# Patient Record
Sex: Male | Born: 1995 | Race: Black or African American | Hispanic: No | Marital: Single | Smoking: Current every day smoker
Health system: Southern US, Community
[De-identification: ages and names within clinical notes are randomized; demographics above are authoritative.]

## PROBLEM LIST (undated history)

## (undated) DIAGNOSIS — R454 Irritability and anger: Secondary | ICD-10-CM

## (undated) DIAGNOSIS — F32A Depression, unspecified: Secondary | ICD-10-CM

## (undated) DIAGNOSIS — L309 Dermatitis, unspecified: Secondary | ICD-10-CM

## (undated) DIAGNOSIS — W3400XA Accidental discharge from unspecified firearms or gun, initial encounter: Secondary | ICD-10-CM

## (undated) DIAGNOSIS — Q249 Congenital malformation of heart, unspecified: Secondary | ICD-10-CM

## (undated) DIAGNOSIS — F329 Major depressive disorder, single episode, unspecified: Secondary | ICD-10-CM

## (undated) HISTORY — DX: Major depressive disorder, single episode, unspecified: F32.9

## (undated) HISTORY — DX: Depression, unspecified: F32.A

## (undated) HISTORY — DX: Congenital malformation of heart, unspecified: Q24.9

## (undated) HISTORY — DX: Dermatitis, unspecified: L30.9

## (undated) HISTORY — PX: CARDIAC SURGERY: SHX584

## (undated) HISTORY — PX: EXPLORATION POST OPERATIVE OPEN HEART: SHX5061

## (undated) HISTORY — DX: Irritability and anger: R45.4

## (undated) HISTORY — PX: LUNG SURGERY: SHX703

---
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
## 2014-01-01 MED ORDER — OXYCODONE-ACETAMINOPHEN 5-325 MG PO TABS
1.0000 | ORAL_TABLET | Freq: Once | ORAL | Status: AC
  Administered 2014-01-01: 1 via ORAL
  Filled 2014-01-01: qty 1

## 2014-01-01 MED ORDER — LIDOCAINE HCL 1 % IJ SOLN
INTRAMUSCULAR | Status: AC
  Filled 2014-01-01: qty 20

## 2014-01-01 NOTE — ED Notes (Signed)
Pts R arm sutured and dressed by Dr Patria Mane

## 2014-01-01 NOTE — ED Provider Notes (Signed)
Medical screening examination/treatment/procedure(s) were conducted as a shared visit with non-physician practitioner(s) and myself.  I personally evaluated the patient during the encounter.   EKG Interpretation None      LACERATION REPAIR Performed by: Lyanne Co Consent: Verbal consent obtained. Risks and benefits: risks, benefits and alternatives were discussed Patient identity confirmed: provided demographic data Time out performed prior to procedure Prepped and Draped in normal sterile fashion Wound explored Laceration Location: Left distal upper arm on the lateral aspect Laceration Length: 3 cm No Foreign Bodies seen or palpated Anesthesia: local infiltration Local anesthetic: lidocaine 2 % with epinephrine Anesthetic total: 7 ml Irrigation method: syringe Amount of cleaning: standard Skin closure: 4-0 Prolene  Number of sutures or staples: 6  Technique: Running interlocked  Patient tolerance: Patient tolerated the procedure well with no immediate complications.  Normal left radial pulse.  Full range of motion of left elbow.  X-ray without abnormalities.  Discharge him in good condition.  Infection warnings given.   Lyanne Co, MD 01/01/14 [phone]

## 2014-02-21 ENCOUNTER — Encounter (HOSPITAL_COMMUNITY): Payer: Self-pay | Admitting: Adult Health

## 2014-02-21 ENCOUNTER — Emergency Department (HOSPITAL_COMMUNITY)
Admission: EM | Admit: 2014-02-21 | Discharge: 2014-02-21 | Disposition: A | Discharge status: Home / Self Care | Payer: Medicaid Other | Attending: Emergency Medicine | Admitting: Emergency Medicine

## 2014-02-21 ENCOUNTER — Emergency Department (HOSPITAL_COMMUNITY): Payer: Medicaid Other

## 2014-02-21 DIAGNOSIS — Z720 Tobacco use: Secondary | ICD-10-CM | POA: Insufficient documentation

## 2014-02-21 DIAGNOSIS — R079 Chest pain, unspecified: Secondary | ICD-10-CM

## 2014-02-21 DIAGNOSIS — Z791 Long term (current) use of non-steroidal anti-inflammatories (NSAID): Secondary | ICD-10-CM | POA: Insufficient documentation

## 2014-02-21 DIAGNOSIS — Z9889 Other specified postprocedural states: Secondary | ICD-10-CM | POA: Insufficient documentation

## 2014-02-21 DIAGNOSIS — R05 Cough: Secondary | ICD-10-CM | POA: Insufficient documentation

## 2014-02-21 LAB — BASIC METABOLIC PANEL
Anion gap: 14 (ref 5–15)
BUN: 11 mg/dL (ref 6–23)
CO2: 26 mEq/L (ref 19–32)
Calcium: 9.6 mg/dL (ref 8.4–10.5)
Chloride: 104 mEq/L (ref 96–112)
Creatinine, Ser: 1.28 mg/dL (ref 0.50–1.35)
GFR, EST NON AFRICAN AMERICAN: 80 mL/min — AB (ref >90)
Glucose, Bld: 111 mg/dL — ABNORMAL HIGH (ref 70–99)
POTASSIUM: 4.4 meq/L (ref 3.7–5.3)
Sodium: 144 mEq/L (ref 137–147)

## 2014-02-21 LAB — CBC
HCT: 46.6 % (ref 39.0–52.0)
Hemoglobin: 15.9 g/dL (ref 13.0–17.0)
MCH: 30.8 pg (ref 26.0–34.0)
MCHC: 34.1 g/dL (ref 30.0–36.0)
MCV: 90.1 fL (ref 78.0–100.0)
PLATELETS: 225 10*3/uL (ref 150–400)
RBC: 5.17 MIL/uL (ref 4.22–5.81)
RDW: 12.5 % (ref 11.5–15.5)
WBC: 6.4 10*3/uL (ref 4.0–10.5)

## 2014-02-21 LAB — I-STAT TROPONIN, ED: TROPONIN I, POC: 0.01 ng/mL (ref 0.00–0.08)

## 2014-02-21 LAB — D-DIMER, QUANTITATIVE: D-Dimer, Quant: <0.27 ug/mL-FEU (ref 0.00–0.48)

## 2014-02-21 MED ORDER — IBUPROFEN 600 MG PO TABS: 600.0000 mg | ORAL_TABLET | Freq: Three times a day (TID) | ORAL | Status: AC

## 2014-02-21 MED ORDER — KETOROLAC TROMETHAMINE 30 MG/ML IJ SOLN
30.0000 mg | Freq: Once | INTRAMUSCULAR | Status: AC
  Administered 2014-02-21: 30 mg via INTRAMUSCULAR
  Filled 2014-02-21: qty 1

## 2014-02-21 NOTE — ED Notes (Signed)
Presents with left sided chest pain described as sharp and intermittent began this AM associated with dizziness and worse pain when breathing in. Hx of open heart surgery as a child. Current smoker, non drinker, denies drug use. Nothing makes pain better.

## 2014-02-21 NOTE — ED Provider Notes (Signed)
CSN: [id number]     Arrival date & time 02/21/14  1610 History   First MD Initiated Contact with Patient 02/21/14 1752     Chief Complaint  Patient presents with  . Chest Pain      HPI  Patient is a concern of pain in his left upper chest. Patient is worse with deep inspiration. There is no new dyspnea, no exertional pain. No headaches, fever, chills. Patient smokes.  Discussed smoking cessation, at length, with patient.   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  No complication, no issues since childhood.    History reviewed. No pertinent past medical history. Past Surgical History  Procedure Laterality Date  . Cardiac surgery    . Lung surgery     History reviewed. No pertinent family history. History  Substance Use Topics  . Smoking status: Current Every Day Smoker  . Smokeless tobacco: Not on file  . Alcohol Use: Yes    Review of Systems  Constitutional:       Per HPI, otherwise negative  HENT:       Per HPI, otherwise negative  Respiratory: Positive for cough and chest tightness.   Cardiovascular:       Per HPI, otherwise negative  Gastrointestinal: Negative for vomiting.  Endocrine:       Negative aside from HPI  Genitourinary:       Neg aside from HPI   Musculoskeletal:       Per HPI, otherwise negative  Skin: Negative.   Neurological: Negative for syncope and weakness.      Allergies  Review of patient's allergies indicates no known allergies.  Home Medications   Prior to Admission medications   Medication Sig Start Date End Date Taking? Authorizing Provider  naproxen (NAPROSYN) 500 MG tablet Take 1 tablet (500 mg total) by mouth 2 (two) times daily. 10/30/13   Robyn M Hess, PA-C  sodium chloride (OCEAN) 0.65 % SOLN nasal spray Place 1 spray into both nostrils as needed for congestion. 08/24/13   Junius FinnerErin O'Malley, PA-C   BP 107/81 mmHg  Pulse 84  Temp(Src) 98.1 F (36.7 C) (Oral)  Resp 20  Ht 5'  10" (1.778 m)  Wt 172 lb (78.019 kg)  BMI 24.68 kg/m2  SpO2 97% Physical Exam  Constitutional: He is oriented to person, place, and time. He appears well-developed. No distress.  HENT:  Head: Normocephalic and atraumatic.  Eyes: Conjunctivae and EOM are normal.  Cardiovascular: Normal rate and regular rhythm.   Prominent murmur audible in all fields  Pulmonary/Chest: Effort normal. No stridor. No respiratory distress.    Abdominal: He exhibits no distension.  Musculoskeletal: He exhibits no edema.  Neurological: He is alert and oriented to person, place, and time.  Skin: Skin is warm and dry.  Psychiatric: He has a normal mood and affect.  Nursing note and vitals reviewed.   ED Course  Procedures (including critical care time) Labs Review Labs Reviewed  BASIC METABOLIC PANEL - Abnormal; Notable for the following:    Glucose, Bld 111 (*)    GFR calc non Af Amer 80 (*)    All other components within normal limits  CBC  D-DIMER, QUANTITATIVE  I-STAT TROPOININ, ED    Imaging Review Dg Chest 2 View  02/21/2014   CLINICAL DATA:  Intermittent left-sided chest pain starting this a.m., dizziness  EXAM: CHEST  2 VIEW  COMPARISON:  None.  FINDINGS: Borderline cardiomegaly is noted. Postsurgical changes  are noted with a vascular stent in right hilum. No acute infiltrate or pulmonary edema. Surgical clips are noted in GE junction region. Bony thorax is unremarkable.  IMPRESSION: No active disease.  Postsurgical changes as described above.   Electronically Signed   By: Natasha MeadLiviu  Pop M.D.   On: 02/21/2014 17:24     EKG Interpretation   Date/Time:  Tuesday February 21 2014 16:14:09 EST Ventricular Rate:  89 PR Interval:  154 QRS Duration: 168 QT Interval:  412 QTC Calculation: 501 R Axis:   -178 Text Interpretation:  Normal sinus rhythm Right bundle branch block  Anterior infarct , age undetermined Abnormal ECG Sinus rhythm Non-specific  intra-ventricular conduction delay T wave  abnormality No significant  change since last tracing Abnormal ekg Confirmed by Gerhard MunchLOCKWOOD, Tanajah Boulter  MD  [phone]([id number]) on 02/21/2014 8:38:36 PM     8:37 PM On repeat exam the patient is awake and alert, talking on the telephone.  He is clearly in no distress. I discussed return precautions, follow-up instructions with patient and his grandmother. MDM   Final diagnoses:  Chest pain    Well-appearing young male presents with reproducible chest pain.  The patient has a history of congenital cardiac disease, there is no suspicion today for new ischemic changes, new pulmonary embolism, new pneumonia. Patient remained in no distress throughout his emergency department course, with no new arrhythmia, and given the reassuring findings, will follow up with his current allergy team tomorrow via telephone.    Gerhard Munchobert Jaci Desanto, MD 02/21/14 2039

## 2014-02-21 NOTE — Discharge Instructions (Signed)
As discussed, your evaluation today has been largely reassuring.  But, it is important that you monitor your condition carefully, and do not hesitate to return to the ED if you develop new, or concerning changes in your condition. ° °Otherwise, please follow-up with your physician for appropriate ongoing care. ° °Chest Pain (Nonspecific) °It is often hard to give a diagnosis for the cause of chest pain. There is always a chance that your pain could be related to something serious, such as a heart attack or a blood clot in the lungs. You need to follow up with your doctor. °HOME CARE °· If antibiotic medicine was given, take it as directed by your doctor. Finish the medicine even if you start to feel better. °· For the next few days, avoid activities that bring on chest pain. Continue physical activities as told by your doctor. °· Do not use any tobacco products. This includes cigarettes, chewing tobacco, and e-cigarettes. °· Avoid drinking alcohol. °· Only take medicine as told by your doctor. °· Follow your doctor's suggestions for more testing if your chest pain does not go away. °· Keep all doctor visits you made. °GET HELP IF: °· Your chest pain does not go away, even after treatment. °· You have a rash with blisters on your chest. °· You have a fever. °GET HELP RIGHT AWAY IF:  °· You have more pain or pain that spreads to your arm, neck, jaw, back, or belly (abdomen). °· You have shortness of breath. °· You cough more than usual or cough up blood. °· You have very bad back or belly pain. °· You feel sick to your stomach (nauseous) or throw up (vomit). °· You have very bad weakness. °· You pass out (faint). °· You have chills. °This is an emergency. Do not wait to see if the problems will go away. Call your local emergency services (911 in [place] Do not drive yourself to the hospital. °MAKE SURE YOU:  °· Understand these instructions. °· Will watch your condition. °· Will get help right away if you are not doing  well or get worse. °Document Released: 09/17/2007 Document Revised: 04/05/2013 Document Reviewed: 09/17/2007 °[employer]® Patient Information ©2015 [employer]. This information is not intended to replace advice given to you by your health care provider. Make sure you discuss any questions you have with your health care provider. ° °

## 2014-04-17 ENCOUNTER — Encounter (HOSPITAL_COMMUNITY): Payer: Self-pay | Admitting: Emergency Medicine

## 2014-04-17 DIAGNOSIS — R011 Cardiac murmur, unspecified: Secondary | ICD-10-CM | POA: Insufficient documentation

## 2014-04-17 DIAGNOSIS — R51 Headache: Secondary | ICD-10-CM | POA: Insufficient documentation

## 2014-04-17 DIAGNOSIS — M542 Cervicalgia: Secondary | ICD-10-CM | POA: Insufficient documentation

## 2014-04-17 DIAGNOSIS — Z720 Tobacco use: Secondary | ICD-10-CM | POA: Insufficient documentation

## 2014-04-17 NOTE — ED Notes (Signed)
States ive had a headache since noon today.  Denies having blurred vision or nausea.  States I've been real sleepy.

## 2014-04-18 ENCOUNTER — Emergency Department (HOSPITAL_COMMUNITY)
Admission: EM | Admit: 2014-04-18 | Discharge: 2014-04-18 | Disposition: A | Discharge status: Home / Self Care | Payer: Medicaid Other | Attending: Emergency Medicine | Admitting: Emergency Medicine

## 2014-04-18 DIAGNOSIS — R519 Headache, unspecified: Secondary | ICD-10-CM

## 2014-04-18 DIAGNOSIS — R51 Headache: Secondary | ICD-10-CM

## 2014-04-18 MED ORDER — ACETAMINOPHEN 500 MG PO TABS: 1000.0000 mg | ORAL_TABLET | Freq: Once | ORAL | Status: DC

## 2014-04-18 MED ORDER — IBUPROFEN 800 MG PO TABS: 800.0000 mg | ORAL_TABLET | Freq: Once | ORAL | Status: DC

## 2014-04-18 NOTE — ED Notes (Signed)
PT monitored by pulse ox, bp cuff. Pt refused gown. RN Vicente SereneGabriel informed.

## 2014-04-18 NOTE — Discharge Instructions (Signed)
You can take ibuprofen 600 mg and/or acetaminophen 1000 mg every 6 hrs as needed for headache or pain. Return to the ED for any problems listed on the head injury sheet.

## 2014-04-18 NOTE — ED Provider Notes (Signed)
CSN: [id number]     Arrival date & time 04/17/14  2241 History  [id number] chart was scribed for Ward GivensIva L Blair Mesina, MD by Abel PrestoKara Demonbreun, ED Scribe. This patient was seen in room A03C/A03C and the patient's care was started at 2:10 AM.    Chief Complaint  Patient presents with  . Headache     Patient is a 19 y.o. male presenting with headaches. The history is provided by the patient. No language interpreter was used.  Headache Associated symptoms: neck pain   Associated symptoms: no congestion, no fever, no nausea, no photophobia, no sore throat and no vomiting    HPI Comments: Dondra PraderKeaundre D Vanroekel is a 19 y.o. male with PMHx of open heart surgery as an infant and heart murmur, who presents to the Emergency Department complaining of 5/10 throbbing headache in the frontal area  with gradual onset around 12 PM yesterday. Pt has not taken anything for relief.  Pt's heat is electric. Pt's significant other is not experiencing headaches.  Pt takes no medication. Pt smokes a pack a day. Pt works for [employer], packing fruit.  Pt fighting sleep in exam room, he notes he would normally be asleep by now. Pt denies photophobia, noise sensitivity, rhinorrhea, nasal congestion, fever, sore throat, neck pain, nausea, vomiting, visual disturbance. During my exam patient's main complaint is that he is sleepy.  PCP none  History reviewed. No pertinent past medical history. Past Surgical History  Procedure Laterality Date  . Cardiac surgery    . Lung surgery     No family history on file. History  Substance Use Topics  . Smoking status: Current Every Day Smoker    Types: Cigarettes  . Smokeless tobacco: Not on file  . Alcohol Use: 0.6 oz/week    1 Cans of beer per week     Comment: occasional  employed Lives at home Has electric heat  Review of Systems  Constitutional: Negative for fever.  HENT: Negative for congestion and sore throat.   Eyes: Negative for photophobia and visual disturbance.   Gastrointestinal: Negative for nausea and vomiting.  Musculoskeletal: Positive for neck pain.  Neurological: Positive for headaches.  All other systems reviewed and are negative.     Allergies  Review of patient's allergies indicates no known allergies.  Home Medications   Prior to Admission medications   Not on File   BP 105/45 mmHg  Pulse 72  Temp(Src) 97.2 F (36.2 C) (Oral)  Resp 18  Ht 5\' 10"  (1.778 m)  Wt 180 lb (81.647 kg)  BMI 25.83 kg/m2  SpO2 100%  Vital signs normal   Physical Exam  Constitutional: He is oriented to person, place, and time. He appears well-developed and well-nourished.  Non-toxic appearance. He does not appear ill. No distress.  HENT:  Head: Normocephalic and atraumatic.  Right Ear: External ear normal.  Left Ear: External ear normal.  Nose: Nose normal. No mucosal edema or rhinorrhea.  Mouth/Throat: Oropharynx is clear and moist and mucous membranes are normal. No dental abscesses or uvula swelling.  Eyes: Conjunctivae and EOM are normal. Pupils are equal, round, and reactive to light.  Neck: Normal range of motion and full passive range of motion without pain. Neck supple.  Cardiovascular: Normal rate, regular rhythm and normal heart sounds.  Exam reveals no gallop and no friction rub.   No murmur heard. systolic ejection murmur heard best in the LUSB, but also heard throughout his chest  Pulmonary/Chest: Effort normal and breath sounds normal.  No respiratory distress. He has no wheezes. He has no rhonchi. He has no rales. He exhibits no tenderness and no crepitus.  Abdominal: Soft. Normal appearance and bowel sounds are normal. He exhibits no distension. There is no tenderness. There is no rebound and no guarding.  Musculoskeletal: Normal range of motion. He exhibits no edema or tenderness.  Moves all extremities well.   Neurological: He is alert and oriented to person, place, and time. He has normal strength. No cranial nerve deficit.   Skin: Skin is warm, dry and intact. No rash noted. No erythema. No pallor.  Psychiatric: He has a normal mood and affect. His speech is normal and behavior is normal. His mood appears not anxious.  Nursing note and vitals reviewed.   ED Course  Procedures (including critical care time)  Medications  acetaminophen (TYLENOL) tablet 1,000 mg (1,000 mg Oral Not Given 04/18/14 0234)  ibuprofen (ADVIL,MOTRIN) tablet 800 mg (800 mg Oral Not Given 04/18/14 0235)    DIAGNOSTIC STUDIES: Oxygen Saturation is 100% on room air, normal by my interpretation.    COORDINATION OF CARE: 2:23 AM Discussed treatment plan with patient at beside, the patient agrees with the plan and has no further questions at this time. Patient was given the choice of taking something oral or getting IV medications for his headaches. Patient states he just wants Tylenol.  Nurse reports however the patient now just wants to go home. He states he can take Tylenol at home. I went back in room to talk to patient he sitting up in no distress. He states he just wants to be discharged.   Labs Review Labs Reviewed - No data to display  Imaging Review No results found.   EKG Interpretation None      MDM   Final diagnoses:  Headache, unspecified headache type    Plan discharge  Devoria Albe, MD, FACEP   I personally performed the services described in this documentation, which was scribed in my presence. The recorded information has been reviewed and considered.  Devoria Albe, MD, Armando Gang     Ward Givens, MD 04/18/14 [phone]

## 2014-04-27 ENCOUNTER — Emergency Department (HOSPITAL_COMMUNITY)
Admission: EM | Admit: 2014-04-27 | Discharge: 2014-04-27 | Disposition: A | Discharge status: Home / Self Care | Payer: Medicaid Other | Attending: Emergency Medicine | Admitting: Emergency Medicine

## 2014-04-27 ENCOUNTER — Encounter (HOSPITAL_COMMUNITY): Payer: Self-pay | Admitting: *Deleted

## 2014-04-27 DIAGNOSIS — Z720 Tobacco use: Secondary | ICD-10-CM | POA: Insufficient documentation

## 2014-04-27 DIAGNOSIS — B349 Viral infection, unspecified: Secondary | ICD-10-CM | POA: Insufficient documentation

## 2014-04-27 DIAGNOSIS — Z008 Encounter for other general examination: Secondary | ICD-10-CM

## 2014-04-27 DIAGNOSIS — Z0289 Encounter for other administrative examinations: Secondary | ICD-10-CM | POA: Insufficient documentation

## 2014-04-27 NOTE — Discharge Instructions (Signed)
Please follow-up with urgent care Center Please seek a primary care provider Please continue to monitor symptoms closely and if symptoms are to worsen or change (fever greater than 101, chills, sweating, nausea, vomiting, chest pain, shortness of breathe, difficulty breathing, weakness, numbness, tingling, worsening or changes to pain pattern, inability to eat any food or fluids down, blood in the stools, black tarry stools) please report back to the Emergency Department immediately.    Emergency Department Resource Guide 1) Find a Doctor and Pay Out of Pocket Although you won't have to find out who is covered by your insurance plan, it is a good idea to ask around and get recommendations. You will then need to call the office and see if the doctor you have chosen will accept you as a new patient and what types of options they offer for patients who are self-pay. Some doctors offer discounts or will set up payment plans for their patients who do not have insurance, but you will need to ask so you aren't surprised when you get to your appointment.  2) Contact Your Local Health Department Not all health departments have doctors that can see patients for sick visits, but many do, so it is worth a call to see if yours does. If you don't know where your local health department is, you can check in your phone book. The CDC also has a tool to help you locate your state's health department, and many state websites also have listings of all of their local health departments.  3) Find a Walk-in Clinic If your illness is not likely to be very severe or complicated, you may want to try a walk in clinic. These are popping up all over the country in pharmacies, drugstores, and shopping centers. They're usually staffed by nurse practitioners or physician assistants that have been trained to treat common illnesses and complaints. They're usually fairly quick and inexpensive. However, if you have serious medical issues  or chronic medical problems, these are probably not your best option.  No Primary Care Doctor: - Call Health Connect at  [phone] - they can help you locate a primary care doctor that  accepts your insurance, provides certain services, etc. - Physician Referral Service- [phone]  Chronic Pain Problems: Organization         Address  Phone   Notes  [facility]  [phone] Patients need to be referred by their primary care doctor.   Medication Assistance: Organization         Address  Phone   Notes  [facility] [address] [place], [place] [address] [phone]) [phone] --Must be a resident of [facility] -- Must have NO insurance coverage whatsoever (no Medicaid/ Medicare, etc.) -- The pt. MUST have a primary care doctor that directs their care regularly and follows them in the community   MedAssist  [phone]   [employer]  [phone]    Agencies that provide inexpensive medical care: Organization         Address  Phone   Notes  [facility]  [phone]   [facility]    [phone]   [facility] [address] [place], [place] [address] [phone]   [facility] [address], [place] [phone]) [phone]   Planned Parenthood    [phone]   [facility]    [phone]) [phone]  Community Health and [place]  [address], [place] Phone:  [phone], Fax:  [phone] Hours of Operation:  9 am - 6 pm, M-F.  Also accepts Medicaid/Medicare and self-pay.  [facility] for [place], [address], [place] Phone: [phone], Fax: [phone]. Hours of Operation:  8:30 am - 5:30 pm, M-F.  Also accepts Medicaid and self-pay.  [facility] [place] [address], [place] Phone: [phone]   [place], [place], [place]  [phone], Ext. [phone] Mondays & Thursdays: 7-9 AM.  First 15 patients are seen on a first come, first serve basis.    [place] Providers:  Organization         Address  Phone   Notes  [facility] [address], [place] [phone] Also accepts self-pay patients.  [facility] [address], [place]  [phone]   [place], [address], [place] [phone]   [facility] [address] [place] [phone]   Lucianne Lei [address], [place]   [phone] Only accepts [place] Access [place] patients after they have their name applied to their card.   Self-Pay (no insurance) in [facility]:  Organization         Address  Phone   Notes  Sickle Cell Patients, [facility] [place] [phone]   [facility] [place] [phone]   [facility] [place]  [place], [place], [place] [phone]   [facility]/Dr. Osei-Bonsu  [address] [place] or [address], [place] [phone] Phone number for both [place] and [place] locations is the same.  Urgent Medical and [facility] [address] [place] [phone]   [facility] [address] [place] or [address] [phone]   [facility] [address] [place] [phone], phone; [phone], fax Sees patients 1st and 3rd Saturday of every month.  Must not qualify for public or private insurance (i.e. Medicaid, Medicare, [place] Health Choice, Veterans' Benefits)  Household income should be no more than 200% of the poverty level The clinic cannot treat you if you are pregnant or think you are pregnant  Sexually transmitted  diseases are not treated at the clinic.    Dental Care: Organization         Address  Phone  Notes  [facility] Department of [facility] [phone] Accepts children up to age 70 who are enrolled in [place] or [place]; pregnant women with a Medicaid card; and children who have applied for Medicaid or [place] Health Choice, but were declined, whose parents can pay a reduced fee at time of service.  [facility] Department of [facility]  [address], [place] [phone] Accepts children up to age 72 who are enrolled in [place] or [place]; pregnant women with a Medicaid card; and children who have applied for Medicaid or [place] Health Choice, but were declined, whose parents can pay a reduced fee at time of service.  [place]  [phone]  [place] [phone] Patients are seen by appointment only. Walk-ins are not accepted. [place] will see patients 12 years of age and older. Monday - Tuesday (8am-5pm) Most Wednesdays (8:30-5pm) $30 per visit, cash only  [facility] Access [facility]  [address], [facility] [phone] Patients are seen by appointment only. Walk-ins are not accepted. [place] will see patients 54 years of age and older. One Wednesday Evening (Monthly: Volunteer Based).  $30 per visit, cash only  [place]  [phone] for adults; Children under age 31, call Graduate Pediatric Dentistry at [phone]. Children aged 20-14, please call [phone] to request a pediatric application.  Dental services are provided in all areas of dental care including fillings, crowns and bridges, complete and partial dentures, implants, gum treatment, root canals, and extractions. Preventive care is also provided. Treatment is provided to both adults and children. Patients are selected via a  lottery and there is often a waiting list.   [facility] [address] [place]  [phone] [email or web address]   [facility] [address] [place], [place] [phone], Ext. [phone] Second and Fourth Thursday of each month, opens at 6:30 AM; Clinic ends at 9 AM.  Patients are seen on a first-come first-served basis, and a limited number are seen during each clinic.   [facility]  [address] Hillard Danker [place], [place] [phone]   Eligibility Requirements You must have lived in [place], [place], or [place] counties for at least the last three months.   You cannot be eligible for state or federal sponsored [employer], including [employer], [place], or [employer].   You generally cannot be eligible for healthcare insurance through your employer.    How to apply: Eligibility screenings are held every Tuesday and Wednesday afternoon from 1:00 pm until 4:00 pm. You do not need an appointment for the interview!  [facility] [address], [place], [place]   [place]  [place] Department  [place]  [phone]    Behavioral Health Resources in the Community: Intensive Outpatient Programs Organization         Address  Phone  Notes  [place]. [address] [place], [place] [phone]   [facility] [address] [place], [place]   ADS: Alcohol & Drug Svcs [address] [place], [place]   [place] [address],  [place], [place] or [phone]   Substance Abuse Resources Organization         Address  Phone  Notes  Alcohol and Drug Services  [phone]   [place]  [phone]   [place]   Chinita Pester  [phone]   Residential &  Outpatient Substance Abuse Program  [phone]   Psychological Services Organization         Address  Phone  Notes  [facility] [place]  [place]  [phone]   [place] [address], [place] or [phone]    Mobile Crisis Teams Organization         Address  Phone  Notes  [facility], Mobile Crisis Care Unit  [phone]   [facility]  [address] [place], [place]  [facility] [place] [address], [address] [place] [phone]    Self-Help/Support Groups Organization         Address  Phone             Notes  [facility] [place] - variety of support groups  [place] Call for more information  Narcotics Anonymous (NA), [facility] [address], [employer] [place]  2 meetings at this location   Special educational needs teacher         Address  Phone  Notes  [facility]idential [facility] [place],    [place]  [phone]   [facility]  [address] [place] [address]48016, [place], [place]   [place], [place] [phone] Admissions: 8am-3pm M-F  Incentives Substance [place] [address],    [place], [place] [phone]   [facility] [address] [place], [place], [place]   [facility] [address]  [place], [place]   [facility] Programs - Intensive Outpatient [address] Kristeen [address], [place], [place]   [facility] ([place].,  [place], [place] [phone] or [phone]   [facility] ([facility]S) [address], [place], [place] Accepts Medicaid  [facility] [place] [address]  [place] [phone] Substance Abuse/Addiction Treatment   [facility] Organization          Address  Phone  Notes  [facility]  [phone]   Domenic Schwab, PhD [address] Arlis [place]a [place], [place]   [phone] or [phone]   [place] [address] [place] [place], [place] [phone]   [facility] [address] [place], [place] [phone] Insurance/Medicaid/sponsorship through [facility] and Families [address] [place]                                    [place], [place] [phone] [place] [address], [place] [phone]    Dr. Adele Schilder  [phone]   [facility] Dept. 1) [address] [place] 2) [place] 3)  [place] [address], [place] [phone]  [phone]   [place] [phone] or [phone] (After Hours)

## 2014-04-27 NOTE — ED Provider Notes (Signed)
CSN: [id number]     Arrival date & time 04/27/14  1740 History  This chart was scribed for non-physician practitioner, Raymon Mutton, PA-C working with Vida Roller, MD by Gwenyth Ober, ED scribe. This patient was seen in room TR04C/TR04C and the patient's care was [facility] at [facility]   Chief Complaint  Patient presents with  . Letter for School/Work   The history is provided by the patient. No language interpreter was used.   HPI Comments: Colton Blair is a 19 y.o. male who presents to the Emergency Department for work clearance. Pt states he had a stomach virus 3 days ago and that his symptoms are now resolved. He went to work yesterday and his supervisor gave him a form to be filled out by a physician before he can return to work. Pt denies current fever, CP, SOB, difficulty breathing, nausea, vomiting, diarrhea, difficulty urinating, urinary symptoms, blood in stool, dizziness, headache, neck pain and neck stiffness.  No PCP   History reviewed. No pertinent past medical history. Past Surgical History  Procedure Laterality Date  . Cardiac surgery    . Lung surgery     History reviewed. No pertinent family history. History  Substance Use Topics  . Smoking status: Current Every Day Smoker    Types: Cigarettes  . Smokeless tobacco: Not on file  . Alcohol Use: 0.6 oz/week    1 Cans of beer per week     Comment: occasional    Review of Systems  Constitutional: Negative for fever and chills.  Respiratory: Negative for shortness of breath.   Cardiovascular: Negative for chest pain.  Gastrointestinal: Negative for nausea, vomiting, diarrhea and blood in stool.  Musculoskeletal: Negative for neck pain and neck stiffness.  Neurological: Negative for dizziness and headaches.   Allergies  Review of patient's allergies indicates no known allergies.  Home Medications   Prior to Admission medications   Not on File   BP 121/72 mmHg  Pulse 88  Temp(Src) 97.9 F (36.6 C)   Resp 18  SpO2 98% Physical Exam  Constitutional: He is oriented to person, place, and time. He appears well-developed and well-nourished. No distress.  HENT:  Head: Normocephalic and atraumatic.  Eyes: Conjunctivae and EOM are normal. Right eye exhibits no discharge. Left eye exhibits no discharge.  Neck: Normal range of motion. Neck supple.  Cardiovascular: Normal rate, regular rhythm and normal heart sounds.  Exam reveals no friction rub.   No murmur heard. Pulmonary/Chest: Effort normal and breath sounds normal. No respiratory distress. He has no wheezes. He has no rales.  Abdominal: Soft. Bowel sounds are normal. He exhibits no distension. There is no tenderness. There is no rebound and no guarding.  Musculoskeletal: Normal range of motion.  Neurological: He is alert and oriented to person, place, and time. No cranial nerve deficit. He exhibits normal muscle tone. Coordination normal.  Skin: Skin is warm and dry. No rash noted. He is not diaphoretic. No erythema.  Psychiatric: He has a normal mood and affect. His behavior is normal. Thought content normal.  Vitals reviewed.   ED Course  Procedures (including critical care time) DIAGNOSTIC STUDIES: Oxygen Saturation is 98% on RA, normal by my interpretation.    COORDINATION OF CARE: 6:19 PM Advised pt to follow-up with PCP or Urgent Care.  Labs Review Labs Reviewed - No data to display  Imaging Review No results found.   EKG Interpretation None      MDM   Final diagnoses:  Encounter for work capability assessment  Viral illness    Medications - No data to display  Filed Vitals:   04/27/14 1750  BP: 121/72  Pulse: 88  Temp: 97.9 F (36.6 C)  Resp: 18  SpO2: 98%   I personally performed the services described in this documentation, which was scribed in my presence. The recorded information has been reviewed and is accurate.  Patient presenting to the ED with request for form to be filled out for work  regarding his recent illness. Stated that he was sick with a stomach virus on Monday and has been out since Wednesday. Reported that when he came to work on Wednesday he needs to fill out a form stating that he is capable come back to work. Patient reported that he is completely fine and needs work form to be filled out. Patient reports that he does not know who his primary care provider is, but stated that he thinks he has one. This provider discussed with patient that he needs to follow-up with a PCP for these forms to be filled out. Patient understood. Benign exam. Patient stable, afebrile. Patient not septic appearing. Discussed with patient to closely monitor symptoms and if symptoms are to worsen or change to report back to the ED - strict return instructions given.  Patient agreed to plan of care, understood, all questions answered.   Raymon MuttonMarissa Karman [place], PA-C 04/27/14 1826  Vida RollerBrian D Miller, MD 04/28/14 [phone]

## 2014-04-27 NOTE — ED Notes (Signed)
Pt in stating that earlier in the week he was sick and missed work, denies complaints at this time but states he needs a note to return to work. No distress noted.

## 2014-05-09 ENCOUNTER — Emergency Department (HOSPITAL_COMMUNITY): Payer: Medicaid Other

## 2014-05-09 ENCOUNTER — Encounter (HOSPITAL_COMMUNITY): Payer: Self-pay | Admitting: Emergency Medicine

## 2014-05-09 ENCOUNTER — Emergency Department (HOSPITAL_COMMUNITY)
Admission: EM | Admit: 2014-05-09 | Discharge: 2014-05-09 | Disposition: A | Discharge status: Home / Self Care | Payer: Medicaid Other | Attending: Emergency Medicine | Admitting: Emergency Medicine

## 2014-05-09 DIAGNOSIS — Z9889 Other specified postprocedural states: Secondary | ICD-10-CM | POA: Insufficient documentation

## 2014-05-09 DIAGNOSIS — R531 Weakness: Secondary | ICD-10-CM | POA: Insufficient documentation

## 2014-05-09 DIAGNOSIS — Z720 Tobacco use: Secondary | ICD-10-CM | POA: Insufficient documentation

## 2014-05-09 DIAGNOSIS — R079 Chest pain, unspecified: Secondary | ICD-10-CM | POA: Insufficient documentation

## 2014-05-09 DIAGNOSIS — M791 Myalgia: Secondary | ICD-10-CM | POA: Insufficient documentation

## 2014-05-09 DIAGNOSIS — R011 Cardiac murmur, unspecified: Secondary | ICD-10-CM | POA: Insufficient documentation

## 2014-05-09 DIAGNOSIS — R5383 Other fatigue: Secondary | ICD-10-CM | POA: Insufficient documentation

## 2014-05-09 LAB — BASIC METABOLIC PANEL
ANION GAP: 13 (ref 5–15)
BUN: 10 mg/dL (ref 6–23)
CHLORIDE: 106 mmol/L (ref 96–112)
CO2: 20 mmol/L (ref 19–32)
Calcium: 8.6 mg/dL (ref 8.4–10.5)
Creatinine, Ser: 1.18 mg/dL (ref 0.50–1.35)
GFR calc Af Amer: >90 mL/min (ref >90)
GFR, EST NON AFRICAN AMERICAN: 88 mL/min — AB (ref >90)
Glucose, Bld: 130 mg/dL — ABNORMAL HIGH (ref 70–99)
Potassium: 3.5 mmol/L (ref 3.5–5.1)
SODIUM: 139 mmol/L (ref 135–145)

## 2014-05-09 LAB — BRAIN NATRIURETIC PEPTIDE: B NATRIURETIC PEPTIDE 5: 65.1 pg/mL (ref 0.0–100.0)

## 2014-05-09 LAB — I-STAT TROPONIN, ED: Troponin i, poc: 0 ng/mL (ref 0.00–0.08)

## 2014-05-09 LAB — CBC
HCT: 46.2 % (ref 39.0–52.0)
Hemoglobin: 15.7 g/dL (ref 13.0–17.0)
MCH: 30.9 pg (ref 26.0–34.0)
MCHC: 34 g/dL (ref 30.0–36.0)
MCV: 90.9 fL (ref 78.0–100.0)
Platelets: 188 10*3/uL (ref 150–400)
RBC: 5.08 MIL/uL (ref 4.22–5.81)
RDW: 12.4 % (ref 11.5–15.5)
WBC: 7.2 10*3/uL (ref 4.0–10.5)

## 2014-05-09 MED ORDER — KETOROLAC TROMETHAMINE 60 MG/2ML IM SOLN
60.0000 mg | Freq: Once | INTRAMUSCULAR | Status: AC
  Administered 2014-05-09: 60 mg via INTRAMUSCULAR
  Filled 2014-05-09: qty 2

## 2014-05-09 NOTE — ED Notes (Signed)
MD at bedside. 

## 2014-05-09 NOTE — Discharge Instructions (Signed)
Chest Wall Pain °Chest wall pain is pain felt in or around the chest bones and muscles. It may take up to 6 weeks to get better. It may take longer if you are active. Chest wall pain can happen on its own. Other times, things like germs, injury, coughing, or exercise can cause the pain. °HOME CARE  °· Avoid activities that make you tired or cause pain. Try not to use your chest, belly (abdominal), or side muscles. Do not use heavy weights. °· Put ice on the sore area. °¨ Put ice in a plastic bag. °¨ Place a towel between your skin and the bag. °¨ Leave the ice on for 15-20 minutes for the first 2 days. °· Only take medicine as told by your doctor. °GET HELP RIGHT AWAY IF:  °· You have more pain or are very uncomfortable. °· You have a fever. °· Your chest pain gets worse. °· You have new problems. °· You feel sick to your stomach (nauseous) or throw up (vomit). °· You start to sweat or feel lightheaded. °· You have a cough with mucus (phlegm). °· You cough up blood. °MAKE SURE YOU:  °· Understand these instructions. °· Will watch your condition. °· Will get help right away if you are not doing well or get worse. °Document Released: 09/17/2007 Document Revised: 06/23/2011 Document Reviewed: 11/25/2010 °ExitCare® Patient Information ©2015 [employer]. This information is not intended to replace advice given to you by your health care provider. Make sure you discuss any questions you have with your health care provider. ° °

## 2014-05-09 NOTE — ED Provider Notes (Signed)
CSN: [id number]     Arrival date & time 05/09/14  1908 History   First MD Initiated Contact with Patient 05/09/14 1952     Chief Complaint  Patient presents with  . Chest Pain     (Consider location/radiation/quality/duration/timing/severity/associated sxs/prior Treatment) Patient is a 19 y.o. male presenting with chest pain.  Chest Pain Associated symptoms: fatigue and weakness   Associated symptoms: no abdominal pain, no back pain, no cough, no dizziness, no fever, no headache, no nausea, no numbness, no palpitations, no shortness of breath and not vomiting    19 year old male with past history of open heart surgery when he was a baby who presents to ED complaining of 2 days of constant chest pain which she describes as left-sided, achy , nonradiating , 7/10.  Patient denies having history of prior. He also states he feels generally weak.  He reports having body aches and feeling very fatigued. He states the last couple of days all he is done is laying in bed and rest. He denies any leg pain , leg swelling. Denies diaphoresis, nausea, vomiting, diarrhea, fever. Patient reports using marijuana and smoking cigarettes. Specifically denies  IV drug use or cocaine use.  Patient states he tried tramadol 2 days ago which did not help. History reviewed. No pertinent past medical history. Past Surgical History  Procedure Laterality Date  . Cardiac surgery    . Lung surgery     No family history on file. History  Substance Use Topics  . Smoking status: Current Every Day Smoker    Types: Cigarettes  . Smokeless tobacco: Not on file  . Alcohol Use: 0.6 oz/week    1 Cans of beer per week     Comment: occasional    Review of Systems  Constitutional: Positive for fatigue. Negative for fever, activity change and appetite change.  HENT: Negative for congestion, rhinorrhea and sore throat.   Eyes: Negative for visual disturbance.  Respiratory: Negative for cough and shortness of breath.    Cardiovascular: Positive for chest pain. Negative for palpitations and leg swelling.  Gastrointestinal: Negative for nausea, vomiting, abdominal pain and diarrhea.  Genitourinary: Negative for dysuria, flank pain, decreased urine volume and difficulty urinating.  Musculoskeletal: Positive for myalgias. Negative for back pain and neck pain.  Skin: Negative for rash.  Neurological: Positive for weakness. Negative for dizziness, syncope, speech difficulty, light-headedness, numbness and headaches.  Psychiatric/Behavioral: Negative for confusion.      Allergies  Review of patient's allergies indicates no known allergies.  Home Medications   Prior to Admission medications   Not on File   BP 109/66 mmHg  Pulse 73  Temp(Src) 98.7 F (37.1 C) (Oral)  Resp 16  SpO2 97% Physical Exam  Constitutional: He is oriented to person, place, and time. He appears well-developed and well-nourished. No distress.  HENT:  Head: Normocephalic and atraumatic.  Nose: Nose normal.  Mouth/Throat: Oropharynx is clear and moist. No oropharyngeal exudate.  Eyes: Conjunctivae and EOM are normal.  Neck: Normal range of motion. Neck supple. No JVD present.  Cardiovascular: Normal rate, regular rhythm and intact distal pulses.   Murmur heard.  Crescendo systolic murmur is present with a grade of 5/6  Pulmonary/Chest: Effort normal and breath sounds normal. No respiratory distress. He exhibits tenderness and bony tenderness.  Abdominal: Soft. He exhibits no distension. There is no tenderness. There is no rebound and no guarding.  Musculoskeletal: Normal range of motion.  Neurological: He is alert and oriented to person, place, and  time. No cranial nerve deficit.  Skin: Skin is warm and dry. No rash noted.  Psychiatric: He has a normal mood and affect.    ED Course  Procedures (including critical care time) Labs Review Labs Reviewed  BASIC METABOLIC PANEL - Abnormal; Notable for the following:     Glucose, Bld 130 (*)    GFR calc non Af Amer 88 (*)    All other components within normal limits  CBC  BRAIN NATRIURETIC PEPTIDE  I-STAT TROPOININ, ED    Imaging Review Dg Chest 2 View  05/09/2014   CLINICAL DATA:  Central chest pain with deep inspiration  EXAM: CHEST  2 VIEW  COMPARISON:  02/21/2014  FINDINGS: Cardiac shadow is stable. Postsurgical changes are again seen with stents projecting over the right hilar region stable in appearance. The lungs are well aerated bilaterally without focal infiltrate or sizable effusion. No acute bony abnormality is seen.  IMPRESSION: No acute abnormality noted.   Electronically Signed   By: Alcide CleverMark  Lukens M.D.   On: 05/09/2014 20:54     EKG Interpretation None      MDM   Final diagnoses:  None    Colton Blair is a 19 y.o. male with H&P as above.  Patient presented with chest pain. Patient has reproducible chest pain during exam. His other complaint sounds flulike. Patient was given Toradol for symptom relief. EKG is not significantly changed from prior which shows right bundle branch block and no new signs of ischemia. Chest x-ray is benign. Patient has a loud systolic murmur which on review of records was present previously.  Screening labs are benign. I have very low suspicion for  acute structural  Abnormality or ACS at this time. Patient is perc negative.  Patient is deemed stable for discharge. Patient has not seen a cardiologist in many years and I have provided him with follow-up information as an outpatient.  Clinical Impression: 1. Chest pain, unspecified chest pain type     Disposition: Discharge  Condition: Good  I have discussed the results, Dx and Tx plan with the pt(& family if present). He/she/they expressed understanding and agree(s) with the plan. Discharge instructions discussed at great length. Strict return precautions discussed and pt &/or family have verbalized understanding of the instructions. No further questions at  time of discharge.    New Prescriptions   No medications on file    [facility]     [address] [place] [id number] [phone]  To establish primary care, call above  [facility] [address] [facility] [address], [address] [place] [id number] [phone]  To establish cardiology care, call above  [facility] [id number] [place] [id number] [phone]  If symptoms worsen   Pt seen in conjunction with Dr. Geoffery Lyonsouglas Delo, MD  Ames DuraStephen Chellie Vanlue, DO [facility] Emergency [facility] Resident - PGY-2      Ames DuraStephen Dietra Stokely, MD 05/09/14 [id number]  Geoffery Lyonsouglas Delo, MD 05/09/14 [phone]

## 2014-05-09 NOTE — ED Notes (Signed)
Pt in XRAY 

## 2014-05-09 NOTE — ED Notes (Signed)
Pt called for room- no response. Radiology sts they do not have this pt.

## 2014-05-09 NOTE — ED Notes (Signed)
Pt. reports central chest pain onset 3 days ago worse with deep inspiration , mild SOB with occasional dry cough . Denies nausea or diaphoresis .

## 2014-11-02 ENCOUNTER — Encounter (HOSPITAL_COMMUNITY): Payer: Self-pay

## 2014-11-02 ENCOUNTER — Emergency Department (HOSPITAL_COMMUNITY)
Admission: EM | Admit: 2014-11-02 | Discharge: 2014-11-02 | Disposition: A | Discharge status: Home / Self Care | Payer: Medicaid Other | Attending: Emergency Medicine | Admitting: Emergency Medicine

## 2014-11-02 DIAGNOSIS — Z202 Contact with and (suspected) exposure to infections with a predominantly sexual mode of transmission: Secondary | ICD-10-CM | POA: Insufficient documentation

## 2014-11-02 DIAGNOSIS — Z711 Person with feared health complaint in whom no diagnosis is made: Secondary | ICD-10-CM

## 2014-11-02 DIAGNOSIS — R300 Dysuria: Secondary | ICD-10-CM | POA: Insufficient documentation

## 2014-11-02 DIAGNOSIS — Z720 Tobacco use: Secondary | ICD-10-CM | POA: Insufficient documentation

## 2014-11-02 DIAGNOSIS — R360 Urethral discharge without blood: Secondary | ICD-10-CM | POA: Insufficient documentation

## 2014-11-02 LAB — URINE MICROSCOPIC-ADD ON

## 2014-11-02 LAB — URINALYSIS, ROUTINE W REFLEX MICROSCOPIC
Bilirubin Urine: NEGATIVE
Glucose, UA: NEGATIVE mg/dL
Hgb urine dipstick: NEGATIVE
Ketones, ur: NEGATIVE mg/dL
NITRITE: NEGATIVE
Protein, ur: NEGATIVE mg/dL
Specific Gravity, Urine: 1.02 (ref 1.005–1.030)
UROBILINOGEN UA: 1 mg/dL (ref 0.0–1.0)
pH: 6 (ref 5.0–8.0)

## 2014-11-02 MED ORDER — AZITHROMYCIN 250 MG PO TABS
1000.0000 mg | ORAL_TABLET | Freq: Once | ORAL | Status: AC
  Administered 2014-11-02: 1000 mg via ORAL
  Filled 2014-11-02: qty 4

## 2014-11-02 MED ORDER — NAPROXEN 250 MG PO TABS: 250.0000 mg | ORAL_TABLET | Freq: Two times a day (BID) | ORAL | Status: DC

## 2014-11-02 MED ORDER — LIDOCAINE HCL (PF) 1 % IJ SOLN
5.0000 mL | Freq: Once | INTRAMUSCULAR | Status: AC
  Administered 2014-11-02: 0.9 mL

## 2014-11-02 MED ORDER — CEFTRIAXONE SODIUM 250 MG IJ SOLR
250.0000 mg | Freq: Once | INTRAMUSCULAR | Status: AC
  Administered 2014-11-02: 250 mg via INTRAMUSCULAR
  Filled 2014-11-02: qty 250

## 2014-11-02 MED ORDER — LIDOCAINE HCL (PF) 1 % IJ SOLN
INTRAMUSCULAR | Status: AC
  Administered 2014-11-02: 0.9 mL
  Filled 2014-11-02: qty 5

## 2014-11-02 NOTE — ED Notes (Signed)
Pt presents with penile drainage and burning with voiding x 4 days.  Pt denies any abdominal pain; reports recent unprotected sex, unsure if partner is symptomatic.

## 2014-11-02 NOTE — ED Provider Notes (Signed)
CSN: [id number]     Arrival date & time 11/02/14  1207 History  This chart was scribed for non-physician practitioner, Everlene Farrier, PA-C, working with Donnetta Hutching, MD by Charline Bills, ED Scribe. This patient was seen in room TR06C/TR06C and the patient's care was started at 1:28 PM.   No chief complaint on file.  The history is provided by the patient. No language interpreter was used.   HPI Comments: Colton Blair is a 19 y.o. male who presents to the Emergency Department complaining of persistent dysuria and yellowish penile discharge for 3 days. Pt reports unprotected sexual intercourse 1 week ago. He reports associated pus-like penile discharge onset 3 days ago. Pt denies fever, chills, abdominal pain, nausea, vomiting, testicular pain, difficulty urinating, urinary frequency, urinary urgency, hematuria, rashes or genital lesions. He reports h/o STDs in the past.   History reviewed. No pertinent past medical history. Past Surgical History  Procedure Laterality Date  . Cardiac surgery    . Lung surgery     History reviewed. No pertinent family history. History  Substance Use Topics  . Smoking status: Current Every Day Smoker    Types: Cigarettes  . Smokeless tobacco: Not on file  . Alcohol Use: 0.6 oz/week    1 Cans of beer per week     Comment: occasional    Review of Systems  Constitutional: Negative for fever and chills.  HENT: Negative for mouth sores.   Gastrointestinal: Negative for nausea, vomiting and abdominal pain.  Genitourinary: Positive for dysuria and discharge. Negative for urgency, frequency, hematuria, flank pain, penile swelling, scrotal swelling, difficulty urinating, genital sores, penile pain and testicular pain.   Allergies  Review of patient's allergies indicates no known allergies.  Home Medications   Prior to Admission medications   Medication Sig Start Date End Date Taking? Authorizing Provider  naproxen (NAPROSYN) 250 MG tablet Take 1 tablet  (250 mg total) by mouth 2 (two) times daily with a meal. 11/02/14   Everlene Farrier, PA-C   BP 132/78 mmHg  Pulse 81  Temp(Src) 98.1 F (36.7 C) (Oral)  Resp 18  SpO2 100% Physical Exam  Constitutional: He appears well-developed and well-nourished. No distress.  Nontoxic appearing.  HENT:  Head: Normocephalic and atraumatic.  Eyes: Right eye exhibits no discharge. Left eye exhibits no discharge.  Pulmonary/Chest: Effort normal. No respiratory distress.  Abdominal: Soft. There is no tenderness.  Genitourinary: Testes normal. Right testis shows no tenderness. Left testis shows no tenderness. No penile tenderness. Discharge found.  GU exam performed by me with male scribe chaperone. No penile or testicular tenderness. Small amount of discharge on tip of penis. No ulcers, rashes or lesions noted to his genitals.  Neurological: He is alert. Coordination normal.  Skin: Skin is warm and dry. No rash noted. He is not diaphoretic. No erythema. No pallor.  Psychiatric: He has a normal mood and affect. His behavior is normal.  Nursing note and vitals reviewed.  ED Course  Procedures (including critical care time) DIAGNOSTIC STUDIES: Oxygen Saturation is 100% on RA, normal by my interpretation.    COORDINATION OF CARE: 1:33 PM-Discussed treatment plan which includes UA and STD screening with pt at bedside and pt agreed to plan.   Labs Review Labs Reviewed  URINALYSIS, ROUTINE W REFLEX MICROSCOPIC (NOT AT [facility]) - Abnormal; Notable for the following:    APPearance HAZY (*)    Leukocytes, UA MODERATE (*)    All other components within normal limits  URINE MICROSCOPIC-ADD ON -  Abnormal; Notable for the following:    Bacteria, UA FEW (*)    All other components within normal limits  HIV ANTIBODY (ROUTINE TESTING)  RPR  GC/CHLAMYDIA PROBE AMP ([place]) NOT AT [facility]   Imaging Review No results found.   EKG Interpretation None      Filed Vitals:   11/02/14 1211  BP: 132/78   Pulse: 81  Temp: 98.1 F (36.7 C)  TempSrc: Oral  Resp: 18  SpO2: 100%     MDM   Meds given in ED:  Medications  cefTRIAXone (ROCEPHIN) injection 250 mg (250 mg Intramuscular Given 11/02/14 1341)  azithromycin (ZITHROMAX) tablet 1,000 mg (1,000 mg Oral Given 11/02/14 1340)  lidocaine (PF) (XYLOCAINE) 1 % injection 5 mL (0.9 mLs Other Given 11/02/14 1344)    New Prescriptions   NAPROXEN (NAPROSYN) 250 MG TABLET    Take 1 tablet (250 mg total) by mouth 2 (two) times daily with a meal.    Final diagnoses:  Concern about STD in male without diagnosis   This is a 19 year old male who presents to the emergency department with 3 days of penile discharge after unprotected sex one week ago. Patient denies any penile scrotal tenderness or pain. On exam the patient has a small amount of discharge from his tip of his penis. No penile or testicle tenderness. Urinalysis shows moderate leukocytes with 21-50 white blood cells. Concern for STD. Patient's gonorrhea, chlamydia, RPR and HIV are pending. Patient empirically treated with Rocephin and azithromycin in the ED for gonorrhea and chlamydia. Education on safe sex practices provided to the patient. I advised the patient to abstain from sex until his test results return. I advised the patient to inform all partners of his test results. I advised the patient to follow-up with their primary care provider this week. I advised the patient to return to the emergency department with new or worsening symptoms or new concerns. The patient verbalized understanding and agreement with plan.    I personally performed the services described in this documentation, which was scribed in my presence. The recorded information has been reviewed and is accurate.    Everlene Farrier, PA-C 11/02/14 1435  Donnetta Hutching, MD 11/03/14 [phone]

## 2014-11-02 NOTE — Discharge Instructions (Signed)

## 2014-11-03 LAB — GC/CHLAMYDIA PROBE AMP (~~LOC~~) NOT AT ARMC
Chlamydia: NEGATIVE
Neisseria Gonorrhea: NEGATIVE

## 2014-11-03 LAB — RPR: RPR Ser Ql: NONREACTIVE

## 2014-11-03 LAB — HIV ANTIBODY (ROUTINE TESTING W REFLEX): HIV Screen 4th Generation wRfx: NONREACTIVE

## 2015-02-09 ENCOUNTER — Emergency Department (HOSPITAL_COMMUNITY)
Admission: EM | Admit: 2015-02-09 | Discharge: 2015-02-10 | Disposition: A | Discharge status: Home / Self Care | Payer: Medicaid Other | Attending: Emergency Medicine | Admitting: Emergency Medicine

## 2015-02-09 DIAGNOSIS — S91032A Puncture wound without foreign body, left ankle, initial encounter: Secondary | ICD-10-CM

## 2015-02-09 DIAGNOSIS — Y9389 Activity, other specified: Secondary | ICD-10-CM | POA: Insufficient documentation

## 2015-02-09 DIAGNOSIS — Y9289 Other specified places as the place of occurrence of the external cause: Secondary | ICD-10-CM | POA: Insufficient documentation

## 2015-02-09 DIAGNOSIS — S91002A Unspecified open wound, left ankle, initial encounter: Secondary | ICD-10-CM | POA: Insufficient documentation

## 2015-02-09 DIAGNOSIS — Z720 Tobacco use: Secondary | ICD-10-CM | POA: Insufficient documentation

## 2015-02-09 DIAGNOSIS — Y998 Other external cause status: Secondary | ICD-10-CM | POA: Insufficient documentation

## 2015-02-09 DIAGNOSIS — W3400XA Accidental discharge from unspecified firearms or gun, initial encounter: Secondary | ICD-10-CM | POA: Insufficient documentation

## 2015-02-10 ENCOUNTER — Encounter (HOSPITAL_COMMUNITY): Payer: Self-pay | Admitting: Emergency Medicine

## 2015-02-10 ENCOUNTER — Emergency Department (HOSPITAL_COMMUNITY): Payer: Medicaid Other

## 2015-02-10 MED ORDER — HYDROCODONE-ACETAMINOPHEN 5-325 MG PO TABS
2.0000 | ORAL_TABLET | Freq: Once | ORAL | Status: AC
  Administered 2015-02-10: 2 via ORAL
  Filled 2015-02-10: qty 2

## 2015-02-10 MED ORDER — CEPHALEXIN 500 MG PO CAPS: 500.0000 mg | ORAL_CAPSULE | Freq: Four times a day (QID) | ORAL | Status: DC

## 2015-02-10 MED ORDER — HYDROCODONE-ACETAMINOPHEN 5-325 MG PO TABS: 1.0000 | ORAL_TABLET | Freq: Four times a day (QID) | ORAL | Status: DC | PRN
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
## 2015-10-02 ENCOUNTER — Encounter (HOSPITAL_COMMUNITY): Payer: Self-pay | Admitting: Emergency Medicine

## 2015-10-02 ENCOUNTER — Emergency Department (HOSPITAL_COMMUNITY)
Admission: EM | Admit: 2015-10-02 | Discharge: 2015-10-02 | Disposition: A | Discharge status: Home / Self Care | Payer: Medicaid Other | Attending: Emergency Medicine | Admitting: Emergency Medicine

## 2015-10-02 DIAGNOSIS — F17210 Nicotine dependence, cigarettes, uncomplicated: Secondary | ICD-10-CM | POA: Insufficient documentation

## 2015-10-02 DIAGNOSIS — R369 Urethral discharge, unspecified: Secondary | ICD-10-CM | POA: Insufficient documentation

## 2015-10-02 LAB — URINALYSIS, ROUTINE W REFLEX MICROSCOPIC
BILIRUBIN URINE: NEGATIVE
Glucose, UA: NEGATIVE mg/dL
Ketones, ur: NEGATIVE mg/dL
NITRITE: NEGATIVE
Protein, ur: NEGATIVE mg/dL
Specific Gravity, Urine: 1.023 (ref 1.005–1.030)
pH: 6 (ref 5.0–8.0)

## 2015-10-02 LAB — URINE MICROSCOPIC-ADD ON

## 2015-10-02 MED ORDER — AZITHROMYCIN 250 MG PO TABS
1000.0000 mg | ORAL_TABLET | Freq: Once | ORAL | Status: AC
  Administered 2015-10-02: 1000 mg via ORAL
  Filled 2015-10-02: qty 4

## 2015-10-02 MED ORDER — CEFTRIAXONE SODIUM 250 MG IJ SOLR
250.0000 mg | Freq: Once | INTRAMUSCULAR | Status: AC
Start: 2015-10-02 — End: 2015-10-02
  Administered 2015-10-02: 250 mg via INTRAMUSCULAR
  Filled 2015-10-02: qty 250

## 2015-10-02 MED ORDER — LIDOCAINE HCL (PF) 1 % IJ SOLN
2.0000 mL | Freq: Once | INTRAMUSCULAR | Status: AC
  Administered 2015-10-02: 2 mL
  Filled 2015-10-02: qty 5

## 2015-10-02 NOTE — Discharge Instructions (Signed)
Mr. Colton Blair,  Nice meeting you! Please follow-up with your primary care provider. Return to the emergency department if you develop fevers, chills, nausea/vomiting, penile pain, scrotal pain, new/worsening symptoms. Feel better soon!  S. [name], PA-C

## 2015-10-02 NOTE — ED Notes (Signed)
Pt. reports penile discharge with burning sensation when urinating onset 2 days ago .
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
## 2015-10-03 LAB — HIV ANTIBODY (ROUTINE TESTING W REFLEX): HIV SCREEN 4TH GENERATION: NONREACTIVE

## 2015-10-03 LAB — RPR: RPR: NONREACTIVE

## 2015-11-10 ENCOUNTER — Encounter (HOSPITAL_COMMUNITY): Payer: Self-pay

## 2015-11-10 ENCOUNTER — Emergency Department (HOSPITAL_COMMUNITY)
Admission: EM | Admit: 2015-11-10 | Discharge: 2015-11-10 | Disposition: A | Discharge status: Home / Self Care | Payer: Medicaid Other | Attending: Emergency Medicine | Admitting: Emergency Medicine

## 2015-11-10 DIAGNOSIS — M25552 Pain in left hip: Secondary | ICD-10-CM | POA: Insufficient documentation

## 2015-11-10 DIAGNOSIS — F17210 Nicotine dependence, cigarettes, uncomplicated: Secondary | ICD-10-CM | POA: Insufficient documentation

## 2015-11-10 DIAGNOSIS — Y939 Activity, unspecified: Secondary | ICD-10-CM | POA: Insufficient documentation

## 2015-11-10 DIAGNOSIS — W228XXA Striking against or struck by other objects, initial encounter: Secondary | ICD-10-CM | POA: Insufficient documentation

## 2015-11-10 DIAGNOSIS — Y999 Unspecified external cause status: Secondary | ICD-10-CM | POA: Insufficient documentation

## 2015-11-10 DIAGNOSIS — Y929 Unspecified place or not applicable: Secondary | ICD-10-CM | POA: Insufficient documentation
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
## 2015-11-10 NOTE — Discharge Instructions (Signed)
Follow-up with your doctor as needed. Return to ED for new or worsening symptoms.

## 2016-01-13 HISTORY — PX: SPLENECTOMY: SUR1306

## 2016-02-10 ENCOUNTER — Other Ambulatory Visit: Payer: Self-pay | Admitting: Emergency Medicine

## 2016-02-10 ENCOUNTER — Emergency Department (HOSPITAL_COMMUNITY): Payer: Self-pay

## 2016-02-10 ENCOUNTER — Encounter (HOSPITAL_COMMUNITY): Admission: EM | Disposition: A | Discharge status: Home / Self Care | Payer: Self-pay | Source: Home / Self Care

## 2016-02-10 ENCOUNTER — Inpatient Hospital Stay (HOSPITAL_COMMUNITY): Payer: Self-pay | Admitting: Certified Registered"

## 2016-02-10 ENCOUNTER — Encounter (HOSPITAL_COMMUNITY): Payer: Self-pay | Admitting: Emergency Medicine

## 2016-02-10 ENCOUNTER — Inpatient Hospital Stay (HOSPITAL_COMMUNITY)
Admission: EM | Admit: 2016-02-10 | Discharge: 2016-02-19 | DRG: 957 | Disposition: A | Discharge status: Home / Self Care | Payer: Self-pay | Attending: General Surgery | Admitting: General Surgery

## 2016-02-10 ENCOUNTER — Inpatient Hospital Stay (HOSPITAL_COMMUNITY): Payer: Self-pay

## 2016-02-10 DIAGNOSIS — R262 Difficulty in walking, not elsewhere classified: Secondary | ICD-10-CM

## 2016-02-10 DIAGNOSIS — E875 Hyperkalemia: Secondary | ICD-10-CM | POA: Diagnosis not present

## 2016-02-10 DIAGNOSIS — F17210 Nicotine dependence, cigarettes, uncomplicated: Secondary | ICD-10-CM | POA: Diagnosis present

## 2016-02-10 DIAGNOSIS — S36039A Unspecified laceration of spleen, initial encounter: Secondary | ICD-10-CM | POA: Diagnosis present

## 2016-02-10 DIAGNOSIS — S27819A Unspecified injury of esophagus (thoracic part), initial encounter: Secondary | ICD-10-CM

## 2016-02-10 DIAGNOSIS — S299XXA Unspecified injury of thorax, initial encounter: Secondary | ICD-10-CM

## 2016-02-10 DIAGNOSIS — W3400XA Accidental discharge from unspecified firearms or gun, initial encounter: Secondary | ICD-10-CM

## 2016-02-10 DIAGNOSIS — E876 Hypokalemia: Secondary | ICD-10-CM | POA: Diagnosis not present

## 2016-02-10 DIAGNOSIS — S21349A Puncture wound with foreign body of unspecified front wall of thorax with penetration into thoracic cavity, initial encounter: Secondary | ICD-10-CM

## 2016-02-10 DIAGNOSIS — D62 Acute posthemorrhagic anemia: Secondary | ICD-10-CM | POA: Diagnosis not present

## 2016-02-10 DIAGNOSIS — S27803A Laceration of diaphragm, initial encounter: Secondary | ICD-10-CM | POA: Diagnosis present

## 2016-02-10 DIAGNOSIS — S272XXA Traumatic hemopneumothorax, initial encounter: Secondary | ICD-10-CM | POA: Diagnosis present

## 2016-02-10 DIAGNOSIS — R571 Hypovolemic shock: Secondary | ICD-10-CM | POA: Diagnosis present

## 2016-02-10 DIAGNOSIS — S22089A Unspecified fracture of T11-T12 vertebra, initial encounter for closed fracture: Secondary | ICD-10-CM | POA: Diagnosis present

## 2016-02-10 DIAGNOSIS — S27809A Unspecified injury of diaphragm, initial encounter: Secondary | ICD-10-CM | POA: Diagnosis present

## 2016-02-10 DIAGNOSIS — S2243XB Multiple fractures of ribs, bilateral, initial encounter for open fracture: Secondary | ICD-10-CM | POA: Diagnosis present

## 2016-02-10 DIAGNOSIS — S21131A Puncture wound without foreign body of right front wall of thorax without penetration into thoracic cavity, initial encounter: Principal | ICD-10-CM

## 2016-02-10 DIAGNOSIS — K5900 Constipation, unspecified: Secondary | ICD-10-CM | POA: Diagnosis present

## 2016-02-10 DIAGNOSIS — Z8709 Personal history of other diseases of the respiratory system: Secondary | ICD-10-CM

## 2016-02-10 DIAGNOSIS — S21139A Puncture wound without foreign body of unspecified front wall of thorax without penetration into thoracic cavity, initial encounter: Secondary | ICD-10-CM

## 2016-02-10 DIAGNOSIS — J939 Pneumothorax, unspecified: Secondary | ICD-10-CM

## 2016-02-10 DIAGNOSIS — J9600 Acute respiratory failure, unspecified whether with hypoxia or hypercapnia: Secondary | ICD-10-CM | POA: Diagnosis present

## 2016-02-10 DIAGNOSIS — J9382 Other air leak: Secondary | ICD-10-CM | POA: Diagnosis not present

## 2016-02-10 DIAGNOSIS — D696 Thrombocytopenia, unspecified: Secondary | ICD-10-CM | POA: Diagnosis not present

## 2016-02-10 DIAGNOSIS — Z9689 Presence of other specified functional implants: Secondary | ICD-10-CM

## 2016-02-10 HISTORY — PX: CHEST TUBE INSERTION: SHX231

## 2016-02-10 HISTORY — PX: LAPAROTOMY: SHX154

## 2016-02-10 HISTORY — PX: SPLENECTOMY, TOTAL: SHX788

## 2016-02-10 LAB — PREPARE FRESH FROZEN PLASMA
UNIT DIVISION: 0
Unit division: 0

## 2016-02-10 LAB — PREPARE RBC (CROSSMATCH)

## 2016-02-10 LAB — CBC
HCT: 39.5 % (ref 39.0–52.0)
HCT: 36.6 % — ABNORMAL LOW (ref 39.0–52.0)
HEMATOCRIT: 30.3 % — AB (ref 39.0–52.0)
HEMOGLOBIN: 9.7 g/dL — AB (ref 13.0–17.0)
Hemoglobin: 13.3 g/dL (ref 13.0–17.0)
Hemoglobin: 12.1 g/dL — ABNORMAL LOW (ref 13.0–17.0)
MCH: 30.1 pg (ref 26.0–34.0)
MCH: 30.6 pg (ref 26.0–34.0)
MCH: 29.4 pg (ref 26.0–34.0)
MCHC: 32 g/dL (ref 30.0–36.0)
MCHC: 33.7 g/dL (ref 30.0–36.0)
MCHC: 33.1 g/dL (ref 30.0–36.0)
MCV: 94.1 fL (ref 78.0–100.0)
MCV: 90.8 fL (ref 78.0–100.0)
MCV: 88.8 fL (ref 78.0–100.0)
PLATELETS: 70 10*3/uL — AB (ref 150–400)
Platelets: 180 10*3/uL (ref 150–400)
Platelets: 132 10*3/uL — ABNORMAL LOW (ref 150–400)
RBC: 3.22 MIL/uL — AB (ref 4.22–5.81)
RBC: 4.35 MIL/uL (ref 4.22–5.81)
RBC: 4.12 MIL/uL — ABNORMAL LOW (ref 4.22–5.81)
RDW: 13.1 % (ref 11.5–15.5)
RDW: 14.2 % (ref 11.5–15.5)
RDW: 14.9 % (ref 11.5–15.5)
WBC: 9.9 10*3/uL (ref 4.0–10.5)
WBC: 16.8 10*3/uL — ABNORMAL HIGH (ref 4.0–10.5)
WBC: 10.7 10*3/uL — AB (ref 4.0–10.5)

## 2016-02-10 LAB — POCT I-STAT 7, (LYTES, BLD GAS, ICA,H+H)
ACID-BASE DEFICIT: 11 mmol/L — AB (ref 0.0–2.0)
ACID-BASE DEFICIT: 9 mmol/L — AB (ref 0.0–2.0)
Bicarbonate: 17.6 mmol/L — ABNORMAL LOW (ref 20.0–28.0)
Bicarbonate: 17.7 mmol/L — ABNORMAL LOW (ref 20.0–28.0)
CALCIUM ION: 0.9 mmol/L — AB (ref 1.15–1.40)
CALCIUM ION: 0.72 mmol/L — AB (ref 1.15–1.40)
HEMATOCRIT: 29 % — AB (ref 39.0–52.0)
HEMATOCRIT: 34 % — AB (ref 39.0–52.0)
HEMOGLOBIN: 9.9 g/dL — AB (ref 13.0–17.0)
HEMOGLOBIN: 11.6 g/dL — AB (ref 13.0–17.0)
O2 SAT: 100 %
O2 SAT: 97 %
PH ART: 7.161 — AB (ref 7.350–7.450)
PH ART: 7.247 — AB (ref 7.350–7.450)
PO2 ART: 304 mmHg — AB (ref 83.0–108.0)
POTASSIUM: 4.5 mmol/L (ref 3.5–5.1)
POTASSIUM: 5.2 mmol/L — AB (ref 3.5–5.1)
Patient temperature: 35
Patient temperature: 36
SODIUM: 146 mmol/L — AB (ref 135–145)
Sodium: 147 mmol/L — ABNORMAL HIGH (ref 135–145)
TCO2: 19 mmol/L (ref 0–100)
TCO2: 19 mmol/L (ref 0–100)
pCO2 arterial: 47.8 mmHg (ref 32.0–48.0)
pCO2 arterial: 40.1 mmHg (ref 32.0–48.0)
pO2, Arterial: 99 mmHg (ref 83.0–108.0)

## 2016-02-10 LAB — HEMOGLOBIN AND HEMATOCRIT, BLOOD
HCT: 34.3 % — ABNORMAL LOW (ref 39.0–52.0)
HEMOGLOBIN: 11.6 g/dL — AB (ref 13.0–17.0)

## 2016-02-10 LAB — COMPREHENSIVE METABOLIC PANEL
ALBUMIN: 2.8 g/dL — AB (ref 3.5–5.0)
ALT: 27 U/L (ref 17–63)
ALT: 120 U/L — ABNORMAL HIGH (ref 17–63)
ANION GAP: 12 (ref 5–15)
AST: 40 U/L (ref 15–41)
AST: 211 U/L — AB (ref 15–41)
Albumin: 2.5 g/dL — ABNORMAL LOW (ref 3.5–5.0)
Alkaline Phosphatase: 33 U/L — ABNORMAL LOW (ref 38–126)
Alkaline Phosphatase: 48 U/L (ref 38–126)
Anion gap: 6 (ref 5–15)
BUN: 11 mg/dL (ref 6–20)
BUN: 8 mg/dL (ref 6–20)
CHLORIDE: 111 mmol/L (ref 101–111)
CHLORIDE: 113 mmol/L — AB (ref 101–111)
CO2: 18 mmol/L — ABNORMAL LOW (ref 22–32)
CO2: 21 mmol/L — AB (ref 22–32)
Calcium: 6.8 mg/dL — ABNORMAL LOW (ref 8.9–10.3)
Calcium: 7.8 mg/dL — ABNORMAL LOW (ref 8.9–10.3)
Creatinine, Ser: 1.63 mg/dL — ABNORMAL HIGH (ref 0.61–1.24)
Creatinine, Ser: 0.97 mg/dL (ref 0.61–1.24)
GFR calc Af Amer: >60 mL/min (ref >60)
GFR calc Af Amer: >60 mL/min (ref >60)
GFR calc non Af Amer: >60 mL/min (ref >60)
GFR, EST NON AFRICAN AMERICAN: 59 mL/min — AB (ref >60)
Glucose, Bld: 192 mg/dL — ABNORMAL HIGH (ref 65–99)
Glucose, Bld: 103 mg/dL — ABNORMAL HIGH (ref 65–99)
POTASSIUM: 4.4 mmol/L (ref 3.5–5.1)
POTASSIUM: 4.4 mmol/L (ref 3.5–5.1)
Sodium: 141 mmol/L (ref 135–145)
Sodium: 140 mmol/L (ref 135–145)
Total Bilirubin: 0.7 mg/dL (ref 0.3–1.2)
Total Bilirubin: 1.9 mg/dL — ABNORMAL HIGH (ref 0.3–1.2)
Total Protein: 3.9 g/dL — ABNORMAL LOW (ref 6.5–8.1)
Total Protein: 4.6 g/dL — ABNORMAL LOW (ref 6.5–8.1)

## 2016-02-10 LAB — URINALYSIS, ROUTINE W REFLEX MICROSCOPIC
Bilirubin Urine: NEGATIVE
Glucose, UA: NEGATIVE mg/dL
Hgb urine dipstick: NEGATIVE
Ketones, ur: NEGATIVE mg/dL
LEUKOCYTES UA: NEGATIVE
NITRITE: NEGATIVE
PH: 5.5 (ref 5.0–8.0)
Protein, ur: NEGATIVE mg/dL
SPECIFIC GRAVITY, URINE: 1.021 (ref 1.005–1.030)

## 2016-02-10 LAB — MRSA PCR SCREENING: MRSA BY PCR: NEGATIVE

## 2016-02-10 LAB — POCT I-STAT, CHEM 8
BUN: 10 mg/dL (ref 6–20)
CALCIUM ION: 1.22 mmol/L (ref 1.15–1.40)
CHLORIDE: 111 mmol/L (ref 101–111)
Creatinine, Ser: 0.9 mg/dL (ref 0.61–1.24)
GLUCOSE: 115 mg/dL — AB (ref 65–99)
HCT: 37 % — ABNORMAL LOW (ref 39.0–52.0)
Hemoglobin: 12.6 g/dL — ABNORMAL LOW (ref 13.0–17.0)
Potassium: 4.6 mmol/L (ref 3.5–5.1)
Sodium: 145 mmol/L (ref 135–145)
TCO2: 23 mmol/L (ref 0–100)

## 2016-02-10 LAB — PROTIME-INR
INR: 1.55
INR: 1.73
PROTHROMBIN TIME: 18.7 s — AB (ref 11.4–15.2)
Prothrombin Time: 20.5 seconds — ABNORMAL HIGH (ref 11.4–15.2)

## 2016-02-10 LAB — ETHANOL: Alcohol, Ethyl (B): 226 mg/dL — ABNORMAL HIGH (ref <5)

## 2016-02-10 LAB — TRIGLYCERIDES: Triglycerides: 134 mg/dL (ref <150)

## 2016-02-10 LAB — I-STAT CHEM 8, ED
BUN: 11 mg/dL (ref 6–20)
CREATININE: 1.7 mg/dL — AB (ref 0.61–1.24)
Calcium, Ion: 0.95 mmol/L — ABNORMAL LOW (ref 1.15–1.40)
Chloride: 108 mmol/L (ref 101–111)
Glucose, Bld: 177 mg/dL — ABNORMAL HIGH (ref 65–99)
HEMATOCRIT: 27 % — AB (ref 39.0–52.0)
HEMOGLOBIN: 9.2 g/dL — AB (ref 13.0–17.0)
Potassium: 4.4 mmol/L (ref 3.5–5.1)
SODIUM: 142 mmol/L (ref 135–145)
TCO2: 18 mmol/L (ref 0–100)

## 2016-02-10 LAB — ABO/RH: ABO/RH(D): O POS

## 2016-02-10 LAB — I-STAT CG4 LACTIC ACID, ED: LACTIC ACID, VENOUS: 7.87 mmol/L — AB (ref 0.5–1.9)

## 2016-02-10 LAB — CDS SEROLOGY

## 2016-02-10 SURGERY — LAPAROTOMY, EXPLORATORY
Anesthesia: General

## 2016-02-10 MED ORDER — CEFAZOLIN SODIUM-DEXTROSE 2-3 GM-% IV SOLR
INTRAVENOUS | Status: DC | PRN
  Administered 2016-02-10: 2 g via INTRAVENOUS

## 2016-02-10 MED ORDER — IOPAMIDOL (ISOVUE-300) INJECTION 61%
INTRAVENOUS | Status: AC
  Administered 2016-02-10: 03:00:00
  Filled 2016-02-10: qty 100

## 2016-02-10 MED ORDER — DOCUSATE SODIUM 50 MG/5ML PO LIQD
100.0000 mg | Freq: Two times a day (BID) | ORAL | Status: DC | PRN
  Filled 2016-02-10: qty 10

## 2016-02-10 MED ORDER — ONDANSETRON HCL 4 MG/2ML IJ SOLN: 4.0000 mg | Freq: Four times a day (QID) | INTRAMUSCULAR | Status: DC | PRN

## 2016-02-10 MED ORDER — LACTATED RINGERS IV SOLN
INTRAVENOUS | Status: DC | PRN
  Administered 2016-02-10: 05:00:00 via INTRAVENOUS

## 2016-02-10 MED ORDER — SODIUM CHLORIDE 0.9 % IV SOLN
INTRAVENOUS | Status: AC | PRN
  Administered 2016-02-10: 1000 mL via INTRAVENOUS
  Administered 2016-02-10: 2000 mL via INTRAVENOUS

## 2016-02-10 MED ORDER — PROPOFOL 1000 MG/100ML IV EMUL
0.0000 ug/kg/min | INTRAVENOUS | Status: DC
  Administered 2016-02-10: 30 ug/kg/min via INTRAVENOUS
  Administered 2016-02-10 (×3): 50 ug/kg/min via INTRAVENOUS
  Administered 2016-02-11: 40 ug/kg/min via INTRAVENOUS
  Administered 2016-02-11: 50 ug/kg/min via INTRAVENOUS
  Filled 2016-02-10 (×5): qty 100

## 2016-02-10 MED ORDER — DEXMEDETOMIDINE HCL IN NACL 200 MCG/50ML IV SOLN
INTRAVENOUS | Status: DC | PRN
  Administered 2016-02-10: .7 ug/kg/h via INTRAVENOUS

## 2016-02-10 MED ORDER — LIDOCAINE HCL (CARDIAC) 20 MG/ML IV SOLN
INTRAVENOUS | Status: DC | PRN
  Administered 2016-02-10: 100 mg via INTRATRACHEAL

## 2016-02-10 MED ORDER — PROPOFOL 10 MG/ML IV BOLUS
INTRAVENOUS | Status: AC
  Filled 2016-02-10: qty 40

## 2016-02-10 MED ORDER — MIDAZOLAM HCL 2 MG/2ML IJ SOLN
INTRAMUSCULAR | Status: AC
  Filled 2016-02-10: qty 4

## 2016-02-10 MED ORDER — SODIUM CHLORIDE 0.9 % IV SOLN
Freq: Once | INTRAVENOUS | Status: AC
  Administered 2016-02-10: 10 mL/h via INTRAVENOUS

## 2016-02-10 MED ORDER — CHLORHEXIDINE GLUCONATE 0.12% ORAL RINSE (MEDLINE KIT)
15.0000 mL | Freq: Two times a day (BID) | OROMUCOSAL | Status: DC
  Administered 2016-02-10 – 2016-02-12 (×5): 15 mL via OROMUCOSAL

## 2016-02-10 MED ORDER — 0.9 % SODIUM CHLORIDE (POUR BTL) OPTIME
TOPICAL | Status: DC | PRN
  Administered 2016-02-10: 4000 mL

## 2016-02-10 MED ORDER — SUCCINYLCHOLINE CHLORIDE 20 MG/ML IJ SOLN
INTRAMUSCULAR | Status: DC | PRN
  Administered 2016-02-10: 100 mg via INTRAVENOUS

## 2016-02-10 MED ORDER — IOPAMIDOL (ISOVUE-300) INJECTION 61%
100.0000 mL | Freq: Once | INTRAVENOUS | Status: AC | PRN
  Administered 2016-02-10: 100 mL via INTRAVENOUS

## 2016-02-10 MED ORDER — SODIUM CHLORIDE 0.9 % IV SOLN: Freq: Once | INTRAVENOUS | Status: DC

## 2016-02-10 MED ORDER — CALCIUM CHLORIDE 10 % IV SOLN
INTRAVENOUS | Status: DC | PRN
Start: 2016-02-10 — End: 2016-02-10
  Administered 2016-02-10: 300 mg via INTRAVENOUS
  Administered 2016-02-10: 500 mg via INTRAVENOUS
  Administered 2016-02-10: 200 mg via INTRAVENOUS
  Administered 2016-02-10: 500 mg via INTRAVENOUS

## 2016-02-10 MED ORDER — PROPOFOL 10 MG/ML IV BOLUS
INTRAVENOUS | Status: DC | PRN
  Administered 2016-02-10: 160 mg via INTRAVENOUS

## 2016-02-10 MED ORDER — FENTANYL CITRATE (PF) 250 MCG/5ML IJ SOLN
INTRAMUSCULAR | Status: DC | PRN
  Administered 2016-02-10 (×2): 50 ug via INTRAVENOUS

## 2016-02-10 MED ORDER — FENTANYL CITRATE (PF) 100 MCG/2ML IJ SOLN
INTRAMUSCULAR | Status: AC
  Filled 2016-02-10: qty 2

## 2016-02-10 MED ORDER — FENTANYL CITRATE (PF) 100 MCG/2ML IJ SOLN
100.0000 ug | INTRAMUSCULAR | Status: AC | PRN
  Administered 2016-02-10 (×3): 100 ug via INTRAVENOUS
  Filled 2016-02-10 (×2): qty 2

## 2016-02-10 MED ORDER — FENTANYL CITRATE (PF) 100 MCG/2ML IJ SOLN
100.0000 ug | INTRAMUSCULAR | Status: DC | PRN
  Administered 2016-02-10: 100 ug via INTRAVENOUS
  Filled 2016-02-10 (×2): qty 2

## 2016-02-10 MED ORDER — ORAL CARE MOUTH RINSE
15.0000 mL | Freq: Four times a day (QID) | OROMUCOSAL | Status: DC
  Administered 2016-02-10 – 2016-02-13 (×9): 15 mL via OROMUCOSAL

## 2016-02-10 MED ORDER — ONDANSETRON HCL 4 MG PO TABS
4.0000 mg | ORAL_TABLET | Freq: Four times a day (QID) | ORAL | Status: DC | PRN
  Filled 2016-02-10: qty 1

## 2016-02-10 MED ORDER — MIDAZOLAM HCL 5 MG/5ML IJ SOLN
INTRAMUSCULAR | Status: DC | PRN
  Administered 2016-02-10: 2 mg via INTRAVENOUS

## 2016-02-10 MED ORDER — ROCURONIUM BROMIDE 100 MG/10ML IV SOLN
INTRAVENOUS | Status: DC | PRN
  Administered 2016-02-10 (×2): 50 mg via INTRAVENOUS

## 2016-02-10 MED ORDER — SODIUM CHLORIDE 0.9 % IV SOLN
INTRAVENOUS | Status: DC | PRN
  Administered 2016-02-10 (×3): via INTRAVENOUS

## 2016-02-10 MED ORDER — SODIUM BICARBONATE 8.4 % IV SOLN
INTRAVENOUS | Status: DC | PRN
  Administered 2016-02-10: 50 meq via INTRAVENOUS

## 2016-02-10 MED ORDER — POTASSIUM CHLORIDE IN NACL 20-0.9 MEQ/L-% IV SOLN
INTRAVENOUS | Status: DC
  Administered 2016-02-10 (×2): via INTRAVENOUS
  Administered 2016-02-11: 100 mL/h via INTRAVENOUS
  Administered 2016-02-11: 12:00:00 via INTRAVENOUS
  Administered 2016-02-11: 100 mL/h via INTRAVENOUS
  Administered 2016-02-13 – 2016-02-16 (×2): via INTRAVENOUS
  Filled 2016-02-10 (×10): qty 1000

## 2016-02-10 SURGICAL SUPPLY — 44 items
BLADE SURG ROTATE 9660 (MISCELLANEOUS) IMPLANT
CANISTER SUCTION 2500CC (MISCELLANEOUS) ×5 IMPLANT
CATH TROCAR 32FR (CATHETERS) ×5 IMPLANT
CHLORAPREP W/TINT 26ML (MISCELLANEOUS) ×5 IMPLANT
COVER SURGICAL LIGHT HANDLE (MISCELLANEOUS) ×5 IMPLANT
DRAPE LAPAROSCOPIC ABDOMINAL (DRAPES) ×5 IMPLANT
DRSG COVADERM 4X6 (GAUZE/BANDAGES/DRESSINGS) ×5 IMPLANT
DRSG COVADERM 4X8 (GAUZE/BANDAGES/DRESSINGS) ×5 IMPLANT
DRSG OPSITE POSTOP 4X10 (GAUZE/BANDAGES/DRESSINGS) IMPLANT
DRSG OPSITE POSTOP 4X8 (GAUZE/BANDAGES/DRESSINGS) IMPLANT
ELECT BLADE 6.5 EXT (BLADE) ×5 IMPLANT
ELECT CAUTERY BLADE 6.4 (BLADE) ×5 IMPLANT
ELECT REM PT RETURN 9FT ADLT (ELECTROSURGICAL) ×5
ELECTRODE REM PT RTRN 9FT ADLT (ELECTROSURGICAL) ×3 IMPLANT
GLOVE BIO SURGEON STRL SZ7 (GLOVE) ×5 IMPLANT
GLOVE BIOGEL PI IND STRL 7.5 (GLOVE) ×3 IMPLANT
GLOVE BIOGEL PI INDICATOR 7.5 (GLOVE) ×2
GLOVE ECLIPSE 8.0 STRL XLNG CF (GLOVE) ×10 IMPLANT
GOWN STRL REUS W/ TWL LRG LVL3 (GOWN DISPOSABLE) ×9 IMPLANT
GOWN STRL REUS W/TWL LRG LVL3 (GOWN DISPOSABLE) ×6
KIT BASIN OR (CUSTOM PROCEDURE TRAY) ×5 IMPLANT
KIT ROOM TURNOVER OR (KITS) ×5 IMPLANT
NS IRRIG 1000ML POUR BTL (IV SOLUTION) ×20 IMPLANT
PACK GENERAL/GYN (CUSTOM PROCEDURE TRAY) ×5 IMPLANT
PAD ARMBOARD 7.5X6 YLW CONV (MISCELLANEOUS) ×5 IMPLANT
SEALER TISSUE X1 CVD JAW (INSTRUMENTS) IMPLANT
SPECIMEN JAR MEDIUM (MISCELLANEOUS) ×5 IMPLANT
SPONGE GAUZE 4X4 12PLY STER LF (GAUZE/BANDAGES/DRESSINGS) ×10 IMPLANT
SPONGE LAP 18X18 X RAY DECT (DISPOSABLE) ×10 IMPLANT
STAPLER VISISTAT 35W (STAPLE) ×5 IMPLANT
SUCTION POOLE TIP (SUCTIONS) ×5 IMPLANT
SUT PDS AB 1 TP1 96 (SUTURE) ×10 IMPLANT
SUT SILK 0 FSL (SUTURE) ×10 IMPLANT
SUT SILK 2 0 SH (SUTURE) ×10 IMPLANT
SUT SILK 2 0 SH CR/8 (SUTURE) ×5 IMPLANT
SUT SILK 2 0 TIES 10X30 (SUTURE) ×5 IMPLANT
SUT SILK 3 0 SH CR/8 (SUTURE) ×5 IMPLANT
SUT SILK 3 0 TIES 10X30 (SUTURE) IMPLANT
SUT VIC AB 3-0 SH 18 (SUTURE) IMPLANT
SYSTEM SAHARA CHEST DRAIN RE-I (WOUND CARE) ×5 IMPLANT
TAPE CLOTH SURG 6X10 WHT LF (GAUZE/BANDAGES/DRESSINGS) ×10 IMPLANT
TOWEL OR 17X26 10 PK STRL BLUE (TOWEL DISPOSABLE) ×5 IMPLANT
TRAY FOLEY CATH 16FRSI W/METER (SET/KITS/TRAYS/PACK) ×5 IMPLANT
YANKAUER SUCT BULB TIP NO VENT (SUCTIONS) IMPLANT

## 2016-02-10 NOTE — ED Notes (Signed)
Second unit of [facility] started [id number].
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
## 2016-02-10 NOTE — Transfer of Care (Signed)
Immediate Anesthesia Transfer of Care Note  Patient: Colton Blair  Procedure(s) Performed: Procedure(s): EXPLORATORY LAPAROTOMY, Repair of diaphragm (N/A) CHEST TUBE INSERTION (Left) SPLENECTOMY  Patient Location: PACU and SICU  Anesthesia Type:General  Level of Consciousness: unresponsive and Patient remains intubated per anesthesia plan  Airway & Oxygen Therapy: Patient remains intubated per anesthesia plan  Post-op Assessment: Report given to RN and Post -op Vital signs reviewed and stable  Post vital signs: Reviewed and stable  Last Vitals:  Vitals:   02/10/16 0427 02/10/16 0705  BP: 90/65   Pulse: 88 (P) 79  Resp: (!) 28 (P) 16  Temp:      Last Pain:  Vitals:   02/10/16 0230  PainSc: 10-Worst pain ever         Complications: No apparent anesthesia complications
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
## 2016-02-10 NOTE — Progress Notes (Signed)
   02/10/16 0340  Clinical Encounter Type  Visited With Family  Visit Type Follow-up  Spiritual Encounters  Spiritual Needs Emotional  Stress Factors  Family Stress Factors Family relationships  Escorted family members to consult room B. Provided emotional support.
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
## 2016-02-10 NOTE — Progress Notes (Signed)
Dr. Luisa Hartornett notified of chest tube output from left pleural tube over the last 3 hours. 220cc, 280cc, and 400cc. Also notified of episode of increased sangenous drainage from around left chest tube, soaking through dressing. Patients vital signs stable. Last H&H drawn at 1025, 12/36. Order for repeat H&H.  Evern BioVernon, Yobany Vroom E
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
## 2016-02-10 NOTE — ED Notes (Signed)
Unable to get a oral temp at this time. MD placing chest tube now. Oral temp not reading.
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
## 2016-02-10 NOTE — Anesthesia Procedure Notes (Deleted)
Performed by: Nema Oatley A       

## 2016-02-10 NOTE — ED Notes (Signed)
First unit of PRBC finished. Unit number [id number].

## 2016-02-10 NOTE — ED Notes (Signed)
Unit Num. [id number] finished
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
## 2016-02-10 NOTE — Anesthesia Procedure Notes (Signed)
Central Venous Catheter Insertion Performed by: anesthesiologist Patient location: Pre-op. Preanesthetic checklist: patient identified, IV checked, site marked, risks and benefits discussed, surgical consent, monitors and equipment checked, pre-op evaluation, timeout performed and anesthesia consent Position: Trendelenburg Landmarks identified Catheter size: 9 Fr MAC introducer Procedure performed without using ultrasound guided technique. Attempts: 1 Following insertion, line sutured and dressing applied. Post procedure assessment: blood return through all ports, free fluid flow and no air. Patient tolerated the procedure well with no immediate complications.

## 2016-02-10 NOTE — ED Notes (Signed)
First unit of PRBC started unit number [id number] while in CT.

## 2016-02-10 NOTE — Progress Notes (Signed)
  Patient ID: Colton Blair, male   DOB: 1996/01/03, 20 y.o.   MRN: [id number]  Repeat Hgb 13.6 after 4 units PRBC Worsening abdominal pain BP still in 90's  Concerning for splenic injury/ gastric injury  Will proceed to OR for exploratory laparotomy.   Wilmon ArmsMatthew K. Corliss Skainssuei, MD, [facility]  General/ Trauma Surgery  02/10/2016 4:21 AM

## 2016-02-10 NOTE — Anesthesia Procedure Notes (Signed)
Procedure Name: Intubation Date/Time: 02/10/2016 4:52 AM Performed by: Isidore MoosPAXTON, Aditi Rovira A Pre-anesthesia Checklist: Patient identified, Emergency Drugs available, Suction available, Patient being monitored and Timeout performed Patient Re-evaluated:Patient Re-evaluated prior to inductionOxygen Delivery Method: Circle system utilized Preoxygenation: Pre-oxygenation with 100% oxygen Intubation Type: IV induction, Rapid sequence and Cricoid Pressure applied Laryngoscope Size: Miller and 2 Grade View: Grade I Tube type: Subglottic suction tube Tube size: 7.5 mm Number of attempts: 1 Airway Equipment and Method: Stylet Placement Confirmation: ETT inserted through vocal cords under direct vision,  positive ETCO2 and breath sounds checked- equal and bilateral Secured at: 22 cm Tube secured with: Tape Dental Injury: Teeth and Oropharynx as per pre-operative assessment
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
## 2016-02-11 ENCOUNTER — Inpatient Hospital Stay (HOSPITAL_COMMUNITY): Payer: Self-pay

## 2016-02-11 ENCOUNTER — Encounter (HOSPITAL_COMMUNITY): Payer: Self-pay | Admitting: Surgery

## 2016-02-11 LAB — POCT I-STAT 3, ART BLOOD GAS (G3+)
ACID-BASE DEFICIT: 1 mmol/L (ref 0.0–2.0)
BICARBONATE: 24.6 mmol/L (ref 20.0–28.0)
Bicarbonate: 24.6 mmol/L (ref 20.0–28.0)
O2 Saturation: 100 %
O2 Saturation: 99 %
PO2 ART: 267 mmHg — AB (ref 83.0–108.0)
TCO2: 26 mmol/L (ref 0–100)
TCO2: 26 mmol/L (ref 0–100)
pCO2 arterial: 45.1 mmHg (ref 32.0–48.0)
pCO2 arterial: 37.3 mmHg (ref 32.0–48.0)
pH, Arterial: 7.345 — ABNORMAL LOW (ref 7.350–7.450)
pH, Arterial: 7.429 (ref 7.350–7.450)
pO2, Arterial: 157 mmHg — ABNORMAL HIGH (ref 83.0–108.0)

## 2016-02-11 LAB — BASIC METABOLIC PANEL
ANION GAP: 3 — AB (ref 5–15)
BUN: 14 mg/dL (ref 6–20)
CHLORIDE: 113 mmol/L — AB (ref 101–111)
CO2: 23 mmol/L (ref 22–32)
Calcium: 7.1 mg/dL — ABNORMAL LOW (ref 8.9–10.3)
Creatinine, Ser: 1.2 mg/dL (ref 0.61–1.24)
Glucose, Bld: 125 mg/dL — ABNORMAL HIGH (ref 65–99)
Potassium: 4.8 mmol/L (ref 3.5–5.1)
SODIUM: 139 mmol/L (ref 135–145)

## 2016-02-11 LAB — PREPARE FRESH FROZEN PLASMA
UNIT DIVISION: 0
Unit division: 0
Unit division: 0
Unit division: 0
Unit division: 0
Unit division: 0

## 2016-02-11 LAB — CBC
HEMATOCRIT: 26 % — AB (ref 39.0–52.0)
HEMOGLOBIN: 9 g/dL — AB (ref 13.0–17.0)
MCH: 29.5 pg (ref 26.0–34.0)
MCHC: 34.2 g/dL (ref 30.0–36.0)
MCV: 86.1 fL (ref 78.0–100.0)
Platelets: 75 10*3/uL — ABNORMAL LOW (ref 150–400)
RBC: 3.02 MIL/uL — ABNORMAL LOW (ref 4.22–5.81)
RDW: 15.1 % (ref 11.5–15.5)
WBC: 15.5 10*3/uL — AB (ref 4.0–10.5)

## 2016-02-11 MED ORDER — DIATRIZOATE MEGLUMINE & SODIUM 66-10 % PO SOLN
ORAL | Status: AC
  Filled 2016-02-11: qty 90

## 2016-02-11 MED ORDER — HYDROMORPHONE HCL 1 MG/ML IJ SOLN
0.5000 mg | INTRAMUSCULAR | Status: DC | PRN
  Administered 2016-02-11 – 2016-02-15 (×35): 1 mg via INTRAVENOUS
  Filled 2016-02-11 (×37): qty 1
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
## 2016-02-11 NOTE — Procedures (Signed)
Extubation Procedure Note  Patient Details:   Name: Colton Blair DOB: 08-30-1995 MRN: [id number]   Airway Documentation:     Evaluation  O2 sats: stable throughout Complications: No apparent complications Patient did tolerate procedure well. Bilateral Breath Sounds: Clear   Yes   Pt. Was extubated to a 4L [place] without any complications, dyspnea or stridor noted. Pt. Was instructed on IS x 5, highest goal achieved was 500mL.   [name] L 02/11/2016, 10:09 AM
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
## 2016-02-11 NOTE — Anesthesia Postprocedure Evaluation (Signed)
Anesthesia Post Note  Patient: Colton Blair  Procedure(s) Performed: Procedure(s) (LRB): EXPLORATORY LAPAROTOMY, Repair of diaphragm (N/A) CHEST TUBE INSERTION (Left) SPLENECTOMY  Patient location during evaluation: ICU Anesthesia Type: General Level of consciousness: sedated Pain management: pain level controlled Vital Signs Assessment: post-procedure vital signs reviewed and stable Respiratory status: patient remains intubated per anesthesia plan Cardiovascular status: stable Postop Assessment: no signs of nausea or vomiting Anesthetic complications: no    Last Vitals:  Vitals:   02/11/16 0900 02/11/16 0905  BP:  124/69  Pulse: (!) 117 (!) 115  Resp: (!) 36   Temp:      Last Pain:  Vitals:   02/11/16 0858  TempSrc: Axillary  PainSc:                  Colton Blair

## 2016-02-12 ENCOUNTER — Inpatient Hospital Stay (HOSPITAL_COMMUNITY): Payer: Self-pay

## 2016-02-12 LAB — CBC
HCT: 24.9 % — ABNORMAL LOW (ref 39.0–52.0)
Hemoglobin: 8.6 g/dL — ABNORMAL LOW (ref 13.0–17.0)
MCH: 29.8 pg (ref 26.0–34.0)
MCHC: 34.5 g/dL (ref 30.0–36.0)
MCV: 86.2 fL (ref 78.0–100.0)
PLATELETS: 91 10*3/uL — AB (ref 150–400)
RBC: 2.89 MIL/uL — AB (ref 4.22–5.81)
RDW: 14.7 % (ref 11.5–15.5)
WBC: 17.6 10*3/uL — ABNORMAL HIGH (ref 4.0–10.5)

## 2016-02-12 LAB — CBC WITH DIFFERENTIAL/PLATELET
Basophils Absolute: 0 10*3/uL (ref 0.0–0.1)
Basophils Relative: 0 %
EOS ABS: 0 10*3/uL (ref 0.0–0.7)
Eosinophils Relative: 0 %
HCT: 19.4 % — ABNORMAL LOW (ref 39.0–52.0)
HEMOGLOBIN: 6.5 g/dL — AB (ref 13.0–17.0)
LYMPHS ABS: 1.5 10*3/uL (ref 0.7–4.0)
LYMPHS PCT: 8 %
MCH: 30.4 pg (ref 26.0–34.0)
MCHC: 33.5 g/dL (ref 30.0–36.0)
MCV: 90.7 fL (ref 78.0–100.0)
Monocytes Absolute: 1.6 10*3/uL — ABNORMAL HIGH (ref 0.1–1.0)
Monocytes Relative: 9 %
NEUTROS PCT: 83 %
Neutro Abs: 14.7 10*3/uL — ABNORMAL HIGH (ref 1.7–7.7)
Platelets: 86 10*3/uL — ABNORMAL LOW (ref 150–400)
RBC: 2.14 MIL/uL — AB (ref 4.22–5.81)
RDW: 14.5 % (ref 11.5–15.5)
WBC: 17.8 10*3/uL — AB (ref 4.0–10.5)

## 2016-02-12 LAB — PREPARE RBC (CROSSMATCH)

## 2016-02-12 LAB — BASIC METABOLIC PANEL
Anion gap: 2 — ABNORMAL LOW (ref 5–15)
BUN: 11 mg/dL (ref 6–20)
CHLORIDE: 109 mmol/L (ref 101–111)
CO2: 27 mmol/L (ref 22–32)
Calcium: 7.1 mg/dL — ABNORMAL LOW (ref 8.9–10.3)
Creatinine, Ser: 0.96 mg/dL (ref 0.61–1.24)
GFR calc Af Amer: >60 mL/min (ref >60)
GFR calc non Af Amer: >60 mL/min (ref >60)
Glucose, Bld: 111 mg/dL — ABNORMAL HIGH (ref 65–99)
POTASSIUM: 5.3 mmol/L — AB (ref 3.5–5.1)
SODIUM: 138 mmol/L (ref 135–145)

## 2016-02-12 MED ORDER — SODIUM POLYSTYRENE SULFONATE 15 GM/60ML PO SUSP
30.0000 g | Freq: Once | ORAL | Status: AC
  Administered 2016-02-12: 30 g via ORAL
  Filled 2016-02-12: qty 120

## 2016-02-12 MED ORDER — CALCIUM GLUCONATE 10 % IV SOLN
1.0000 g | Freq: Once | INTRAVENOUS | Status: AC
  Administered 2016-02-12: 1 g via INTRAVENOUS
  Filled 2016-02-12: qty 10

## 2016-02-12 MED ORDER — SODIUM CHLORIDE 0.9 % IV SOLN: Freq: Once | INTRAVENOUS | Status: AC
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
## 2016-02-12 NOTE — Progress Notes (Signed)
CRITICAL VALUE ALERT  Critical value received:  Hgb 6.5  Date of notification:  02/12/2016  Time of notification:  0730  Critical value read back:Yes.    Nurse who received alert:  Burna CashEric Buren Havey  MD notified (1st page):  Dr. Janee Mornhompson  Time of first page:  0732  MD notified (2nd page):  Time of second page:  Responding MD:    Time MD responded:

## 2016-02-13 ENCOUNTER — Inpatient Hospital Stay (HOSPITAL_COMMUNITY): Payer: Self-pay

## 2016-02-13 LAB — TYPE AND SCREEN
ABO/RH(D): O POS
Antibody Screen: NEGATIVE
UNIT DIVISION: 0
UNIT DIVISION: 0
UNIT DIVISION: 0
UNIT DIVISION: 0
UNIT DIVISION: 0
UNIT DIVISION: 0
UNIT DIVISION: 0
UNIT DIVISION: 0
UNIT DIVISION: 0
Unit division: 0
Unit division: 0
Unit division: 0
Unit division: 0
Unit division: 0

## 2016-02-13 LAB — BASIC METABOLIC PANEL
ANION GAP: 5 (ref 5–15)
BUN: 6 mg/dL (ref 6–20)
CALCIUM: 7.2 mg/dL — AB (ref 8.9–10.3)
CO2: 30 mmol/L (ref 22–32)
CREATININE: 0.75 mg/dL (ref 0.61–1.24)
Chloride: 99 mmol/L — ABNORMAL LOW (ref 101–111)
Glucose, Bld: 118 mg/dL — ABNORMAL HIGH (ref 65–99)
Potassium: 4 mmol/L (ref 3.5–5.1)
SODIUM: 134 mmol/L — AB (ref 135–145)

## 2016-02-13 LAB — CBC
HCT: 22.4 % — ABNORMAL LOW (ref 39.0–52.0)
Hemoglobin: 7.6 g/dL — ABNORMAL LOW (ref 13.0–17.0)
MCH: 29.3 pg (ref 26.0–34.0)
MCHC: 33.9 g/dL (ref 30.0–36.0)
MCV: 86.5 fL (ref 78.0–100.0)
PLATELETS: 97 10*3/uL — AB (ref 150–400)
RBC: 2.59 MIL/uL — ABNORMAL LOW (ref 4.22–5.81)
RDW: 14.6 % (ref 11.5–15.5)
WBC: 15.4 10*3/uL — AB (ref 4.0–10.5)

## 2016-02-13 LAB — BLOOD PRODUCT ORDER (VERBAL) VERIFICATION

## 2016-02-13 MED ORDER — ORAL CARE MOUTH RINSE
15.0000 mL | Freq: Two times a day (BID) | OROMUCOSAL | Status: DC
  Administered 2016-02-14 – 2016-02-18 (×7): 15 mL via OROMUCOSAL

## 2016-02-13 MED ORDER — OXYCODONE-ACETAMINOPHEN 5-325 MG PO TABS
1.0000 | ORAL_TABLET | ORAL | Status: DC | PRN
  Administered 2016-02-13 – 2016-02-16 (×12): 2 via ORAL
  Filled 2016-02-13 (×14): qty 2

## 2016-02-13 MED ORDER — FUROSEMIDE 10 MG/ML IJ SOLN
40.0000 mg | Freq: Once | INTRAMUSCULAR | Status: AC
  Administered 2016-02-13: 40 mg via INTRAVENOUS
  Filled 2016-02-13: qty 4
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
## 2016-02-13 NOTE — Progress Notes (Signed)
Rt instructed pt and family on the use of flutter valve. Pt able to demonstrate back good technique. 
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
## 2016-02-14 ENCOUNTER — Inpatient Hospital Stay (HOSPITAL_COMMUNITY): Payer: Self-pay

## 2016-02-14 LAB — CBC WITH DIFFERENTIAL/PLATELET
BASOS PCT: 0 %
Basophils Absolute: 0 10*3/uL (ref 0.0–0.1)
EOS ABS: 0.2 10*3/uL (ref 0.0–0.7)
Eosinophils Relative: 1 %
HCT: 22.6 % — ABNORMAL LOW (ref 39.0–52.0)
HEMOGLOBIN: 7.6 g/dL — AB (ref 13.0–17.0)
Lymphocytes Relative: 7 %
Lymphs Abs: 1 10*3/uL (ref 0.7–4.0)
MCH: 30.2 pg (ref 26.0–34.0)
MCHC: 33.6 g/dL (ref 30.0–36.0)
MCV: 89.7 fL (ref 78.0–100.0)
MONOS PCT: 9 %
Monocytes Absolute: 1.4 10*3/uL — ABNORMAL HIGH (ref 0.1–1.0)
NEUTROS PCT: 82 %
Neutro Abs: 11.9 10*3/uL — ABNORMAL HIGH (ref 1.7–7.7)
Platelets: 113 10*3/uL — ABNORMAL LOW (ref 150–400)
RBC: 2.52 MIL/uL — ABNORMAL LOW (ref 4.22–5.81)
RDW: 14.5 % (ref 11.5–15.5)
WBC: 14.5 10*3/uL — ABNORMAL HIGH (ref 4.0–10.5)

## 2016-02-14 LAB — BASIC METABOLIC PANEL
ANION GAP: 5 (ref 5–15)
BUN: 5 mg/dL — ABNORMAL LOW (ref 6–20)
CALCIUM: 7.2 mg/dL — AB (ref 8.9–10.3)
CO2: 32 mmol/L (ref 22–32)
Chloride: 98 mmol/L — ABNORMAL LOW (ref 101–111)
Creatinine, Ser: 0.69 mg/dL (ref 0.61–1.24)
GFR calc non Af Amer: >60 mL/min (ref >60)
Glucose, Bld: 121 mg/dL — ABNORMAL HIGH (ref 65–99)
Potassium: 3.2 mmol/L — ABNORMAL LOW (ref 3.5–5.1)
SODIUM: 135 mmol/L (ref 135–145)

## 2016-02-14 MED ORDER — POLYETHYLENE GLYCOL 3350 17 G PO PACK
17.0000 g | PACK | Freq: Every day | ORAL | Status: DC
  Administered 2016-02-14 – 2016-02-16 (×3): 17 g via ORAL
  Filled 2016-02-14 (×3): qty 1

## 2016-02-14 MED ORDER — TRAMADOL HCL 50 MG PO TABS
50.0000 mg | ORAL_TABLET | Freq: Four times a day (QID) | ORAL | Status: DC
  Administered 2016-02-14 – 2016-02-16 (×9): 50 mg via ORAL
  Filled 2016-02-14 (×9): qty 1

## 2016-02-14 MED ORDER — POTASSIUM CHLORIDE CRYS ER 20 MEQ PO TBCR
20.0000 meq | EXTENDED_RELEASE_TABLET | Freq: Two times a day (BID) | ORAL | Status: AC
  Administered 2016-02-14 (×2): 20 meq via ORAL
  Filled 2016-02-14 (×2): qty 1
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
## 2016-02-14 NOTE — Progress Notes (Signed)
Dr. Janee Mornhompson made aware of pt with SQ air at chest tube site with increase of suction to 30cm, no interventions at this time, will continue to monitor.   Darrel HooverWilson,Birt Reinoso S
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
## 2016-02-15 ENCOUNTER — Inpatient Hospital Stay (HOSPITAL_COMMUNITY): Payer: Self-pay

## 2016-02-15 ENCOUNTER — Encounter (HOSPITAL_COMMUNITY): Payer: Self-pay

## 2016-02-15 LAB — CBC
HEMATOCRIT: 23.3 % — AB (ref 39.0–52.0)
HEMOGLOBIN: 7.7 g/dL — AB (ref 13.0–17.0)
MCH: 29.6 pg (ref 26.0–34.0)
MCHC: 33 g/dL (ref 30.0–36.0)
MCV: 89.6 fL (ref 78.0–100.0)
Platelets: 188 10*3/uL (ref 150–400)
RBC: 2.6 MIL/uL — ABNORMAL LOW (ref 4.22–5.81)
RDW: 14.6 % (ref 11.5–15.5)
WBC: 14.5 10*3/uL — AB (ref 4.0–10.5)

## 2016-02-15 LAB — BASIC METABOLIC PANEL
ANION GAP: 4 — AB (ref 5–15)
BUN: 5 mg/dL — ABNORMAL LOW (ref 6–20)
CHLORIDE: 101 mmol/L (ref 101–111)
CO2: 32 mmol/L (ref 22–32)
Calcium: 7.6 mg/dL — ABNORMAL LOW (ref 8.9–10.3)
Creatinine, Ser: 0.64 mg/dL (ref 0.61–1.24)
GFR calc non Af Amer: >60 mL/min (ref >60)
GLUCOSE: 104 mg/dL — AB (ref 65–99)
Potassium: 3.4 mmol/L — ABNORMAL LOW (ref 3.5–5.1)
Sodium: 137 mmol/L (ref 135–145)

## 2016-02-15 MED ORDER — SALINE SPRAY 0.65 % NA SOLN
1.0000 | NASAL | Status: DC | PRN
  Filled 2016-02-15: qty 44

## 2016-02-15 MED ORDER — ENOXAPARIN SODIUM 40 MG/0.4ML ~~LOC~~ SOLN
40.0000 mg | SUBCUTANEOUS | Status: DC
  Administered 2016-02-15 – 2016-02-19 (×5): 40 mg via SUBCUTANEOUS
  Filled 2016-02-15 (×5): qty 0.4

## 2016-02-15 MED ORDER — POTASSIUM CHLORIDE CRYS ER 20 MEQ PO TBCR
20.0000 meq | EXTENDED_RELEASE_TABLET | Freq: Two times a day (BID) | ORAL | Status: AC
  Administered 2016-02-15 (×2): 20 meq via ORAL
  Filled 2016-02-15 (×2): qty 1
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
## 2016-02-16 ENCOUNTER — Inpatient Hospital Stay (HOSPITAL_COMMUNITY): Payer: Self-pay

## 2016-02-16 DIAGNOSIS — S36039A Unspecified laceration of spleen, initial encounter: Secondary | ICD-10-CM | POA: Diagnosis present

## 2016-02-16 DIAGNOSIS — S272XXA Traumatic hemopneumothorax, initial encounter: Secondary | ICD-10-CM | POA: Diagnosis present

## 2016-02-16 DIAGNOSIS — J9600 Acute respiratory failure, unspecified whether with hypoxia or hypercapnia: Secondary | ICD-10-CM | POA: Diagnosis present

## 2016-02-16 DIAGNOSIS — D696 Thrombocytopenia, unspecified: Secondary | ICD-10-CM | POA: Diagnosis not present

## 2016-02-16 DIAGNOSIS — S27809A Unspecified injury of diaphragm, initial encounter: Secondary | ICD-10-CM | POA: Diagnosis present

## 2016-02-16 DIAGNOSIS — R571 Hypovolemic shock: Secondary | ICD-10-CM | POA: Diagnosis present

## 2016-02-16 DIAGNOSIS — E876 Hypokalemia: Secondary | ICD-10-CM | POA: Diagnosis not present

## 2016-02-16 DIAGNOSIS — D62 Acute posthemorrhagic anemia: Secondary | ICD-10-CM | POA: Diagnosis not present

## 2016-02-16 LAB — CBC
HEMATOCRIT: 22.5 % — AB (ref 39.0–52.0)
HEMOGLOBIN: 7.5 g/dL — AB (ref 13.0–17.0)
MCH: 30.5 pg (ref 26.0–34.0)
MCHC: 33.3 g/dL (ref 30.0–36.0)
MCV: 91.5 fL (ref 78.0–100.0)
Platelets: 273 10*3/uL (ref 150–400)
RBC: 2.46 MIL/uL — AB (ref 4.22–5.81)
RDW: 15 % (ref 11.5–15.5)
WBC: 13.3 10*3/uL — AB (ref 4.0–10.5)

## 2016-02-16 LAB — BASIC METABOLIC PANEL
ANION GAP: 5 (ref 5–15)
BUN: 5 mg/dL — ABNORMAL LOW (ref 6–20)
CO2: 29 mmol/L (ref 22–32)
Calcium: 7.5 mg/dL — ABNORMAL LOW (ref 8.9–10.3)
Chloride: 102 mmol/L (ref 101–111)
Creatinine, Ser: 0.68 mg/dL (ref 0.61–1.24)
GFR calc non Af Amer: >60 mL/min (ref >60)
GLUCOSE: 107 mg/dL — AB (ref 65–99)
POTASSIUM: 3.9 mmol/L (ref 3.5–5.1)
Sodium: 136 mmol/L (ref 135–145)

## 2016-02-16 MED ORDER — DOCUSATE SODIUM 100 MG PO CAPS
200.0000 mg | ORAL_CAPSULE | Freq: Two times a day (BID) | ORAL | Status: DC
  Administered 2016-02-16 – 2016-02-18 (×5): 200 mg via ORAL
  Filled 2016-02-16 (×8): qty 2

## 2016-02-16 MED ORDER — OXYCODONE HCL 5 MG PO TABS
10.0000 mg | ORAL_TABLET | ORAL | Status: DC | PRN
  Administered 2016-02-16 (×2): 20 mg via ORAL
  Administered 2016-02-17 – 2016-02-19 (×10): 15 mg via ORAL
  Filled 2016-02-16 (×9): qty 3
  Filled 2016-02-16 (×2): qty 4
  Filled 2016-02-16: qty 3

## 2016-02-16 MED ORDER — POLYETHYLENE GLYCOL 3350 17 G PO PACK
17.0000 g | PACK | Freq: Two times a day (BID) | ORAL | Status: DC
  Administered 2016-02-16 – 2016-02-18 (×4): 17 g via ORAL
  Filled 2016-02-16 (×6): qty 1

## 2016-02-16 MED ORDER — TRAMADOL HCL 50 MG PO TABS
100.0000 mg | ORAL_TABLET | Freq: Four times a day (QID) | ORAL | Status: DC
  Administered 2016-02-16 (×2): 100 mg via ORAL
  Filled 2016-02-16 (×3): qty 2

## 2016-02-16 MED ORDER — HYDROMORPHONE HCL 1 MG/ML IJ SOLN
0.5000 mg | INTRAMUSCULAR | Status: DC | PRN
  Administered 2016-02-16 – 2016-02-19 (×5): 0.5 mg via INTRAVENOUS
  Filled 2016-02-16: qty 1
  Filled 2016-02-16 (×4): qty 0.5

## 2016-02-16 MED ORDER — NAPROXEN 250 MG PO TABS
500.0000 mg | ORAL_TABLET | Freq: Two times a day (BID) | ORAL | Status: DC
  Administered 2016-02-16 – 2016-02-19 (×6): 500 mg via ORAL
  Filled 2016-02-16 (×6): qty 2

## 2016-02-16 MED ORDER — DIPHENHYDRAMINE HCL 25 MG PO CAPS
25.0000 mg | ORAL_CAPSULE | Freq: Four times a day (QID) | ORAL | Status: DC | PRN
  Administered 2016-02-16: 25 mg via ORAL
  Filled 2016-02-16: qty 1

## 2016-02-16 NOTE — Progress Notes (Signed)
Patient to transfer to 6N20 report given to receiving nurse Z all questions answered at this time.  Pt. VSS with no s/s of distress noted.  Patient stable for transfer.

## 2016-02-16 NOTE — Progress Notes (Signed)
6 Days Post-Op Procedure(s) (LRB): EXPLORATORY LAPAROTOMY, Repair of diaphragm (N/A) CHEST TUBE INSERTION (Left) SPLENECTOMY Subjective:  No complaints, sluggish from pain meds  Objective: Vital signs in last 24 hours: Temp:  [97.3 F (36.3 C)-99.5 F (37.5 C)] 98.7 F (37.1 C) (11/04 1100) Pulse Rate:  [91-107] 91 (11/04 0700) Cardiac Rhythm: Sinus tachycardia (11/04 1200) Resp:  [18-32] 19 (11/04 0700) BP: (99-130)/(67-77) 119/73 (11/04 0700) SpO2:  [92 %-100 %] 100 % (11/04 0700)  Hemodynamic parameters for last 24 hours:    Intake/Output from previous day: 11/03 0701 - 11/04 0700 In: 1400 [P.O.:1200; I.V.:200] Out: 1995 [Urine:1025; Blood:625; Chest Tube:345] Intake/Output this shift: Total I/O In: 60 [I.V.:60] Out: 595 [Urine:550; Chest Tube:45]  Heart: regular rate and rhythm, S1, S2 normal, no murmur, click, rub or gallop Lungs: clear to auscultation bilaterally No air leak seen from either chest tube.  Lab Results:  Recent Labs  02/15/16 0410 02/16/16 0430  WBC 14.5* 13.3*  HGB 7.7* 7.5*  HCT 23.3* 22.5*  PLT 188 273   BMET:  Recent Labs  02/15/16 0410 02/16/16 0430  NA 137 136  K 3.4* 3.9  CL 101 102  CO2 32 29  GLUCOSE 104* 107*  BUN 5* 5*  CREATININE 0.64 0.68  CALCIUM 7.6* 7.5*    PT/INR: No results for input(s): LABPROT, INR in the last 72 hours. ABG    Component Value Date/Time   PHART 7.429 02/11/2016 0958   HCO3 24.6 02/11/2016 0958   TCO2 26 02/11/2016 0958   ACIDBASEDEF 1.0 02/10/2016 1038   O2SAT 99.0 02/11/2016 0958   CBG (last 3)  No results for input(s): GLUCAP in the last 72 hours.  Assessment/Plan: S/P Procedure(s) (LRB): EXPLORATORY LAPAROTOMY, Repair of diaphragm (N/A) CHEST TUBE INSERTION (Left) SPLENECTOMY  I don't see any air leak and output has been low today. I think they can be removed tomorrow if no changes.   LOS: 6 days    Alleen BorneBryan K Bartle 02/16/2016

## 2016-02-16 NOTE — Progress Notes (Signed)
Received patient from 3S, alert and oriented, not in any distress, CT intact connected to suction. Oriented to unit and staff. Will continue to monitor.
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
## 2016-02-17 ENCOUNTER — Inpatient Hospital Stay (HOSPITAL_COMMUNITY): Payer: Self-pay

## 2016-02-17 MED ORDER — BISACODYL 10 MG RE SUPP
10.0000 mg | Freq: Once | RECTAL | Status: AC
  Administered 2016-02-17: 10 mg via RECTAL
  Filled 2016-02-17: qty 1
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
## 2016-02-18 ENCOUNTER — Inpatient Hospital Stay (HOSPITAL_COMMUNITY): Payer: Self-pay

## 2016-02-18 DIAGNOSIS — J90 Pleural effusion, not elsewhere classified: Secondary | ICD-10-CM

## 2016-02-18 LAB — COMPREHENSIVE METABOLIC PANEL
ALT: 53 U/L (ref 17–63)
AST: 34 U/L (ref 15–41)
Albumin: 2.1 g/dL — ABNORMAL LOW (ref 3.5–5.0)
Alkaline Phosphatase: 74 U/L (ref 38–126)
Anion gap: 5 (ref 5–15)
BUN: 11 mg/dL (ref 6–20)
CHLORIDE: 106 mmol/L (ref 101–111)
CO2: 29 mmol/L (ref 22–32)
CREATININE: 0.76 mg/dL (ref 0.61–1.24)
Calcium: 7.7 mg/dL — ABNORMAL LOW (ref 8.9–10.3)
Glucose, Bld: 113 mg/dL — ABNORMAL HIGH (ref 65–99)
POTASSIUM: 4.3 mmol/L (ref 3.5–5.1)
Sodium: 140 mmol/L (ref 135–145)
Total Bilirubin: 0.7 mg/dL (ref 0.3–1.2)
Total Protein: 4.8 g/dL — ABNORMAL LOW (ref 6.5–8.1)

## 2016-02-18 LAB — CBC
HCT: 24.2 % — ABNORMAL LOW (ref 39.0–52.0)
Hemoglobin: 7.7 g/dL — ABNORMAL LOW (ref 13.0–17.0)
MCH: 29.1 pg (ref 26.0–34.0)
MCHC: 31.8 g/dL (ref 30.0–36.0)
MCV: 91.3 fL (ref 78.0–100.0)
PLATELETS: 637 10*3/uL — AB (ref 150–400)
RBC: 2.65 MIL/uL — ABNORMAL LOW (ref 4.22–5.81)
RDW: 14.9 % (ref 11.5–15.5)
WBC: 14 10*3/uL — ABNORMAL HIGH (ref 4.0–10.5)

## 2016-02-18 MED ORDER — SODIUM CHLORIDE 0.9% FLUSH
10.0000 mL | INTRAVENOUS | Status: DC | PRN
Start: 2016-02-18 — End: 2016-02-19
  Administered 2016-02-18 – 2016-02-19 (×2): 10 mL
  Filled 2016-02-18 (×2): qty 40

## 2016-02-18 NOTE — Progress Notes (Signed)
IV team notified of above . Chest tubes now both to water seal

## 2016-02-18 NOTE — Plan of Care (Signed)
Problem: Activity: Goal: Mobility will improve Outcome: Progressing Pt independently mobile  Problem: Coping: Goal: Level of anxiety will decrease Outcome: Progressing No anxiety issues noted

## 2016-02-18 NOTE — Progress Notes (Signed)
MD paged and he called back regarding requests from iv team to evaluate need for continued use of what they referred to as an introducer. Per their policy, pt with introducers do not come to med/surg floors but go to either icu or stepdown. MD explained that this access device was not an introducer but a triple lumen picc and its use will be reviewed tomorrow during rounding.
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
## 2016-02-18 NOTE — Progress Notes (Addendum)
      [address]       [place] [id number]             [phone]      8 Days Post-Op Procedure(s) (LRB): EXPLORATORY LAPAROTOMY, Repair of diaphragm (N/A) CHEST TUBE INSERTION (Left) SPLENECTOMY   Subjective:  Wants chest tubes out.  Objective: Vital signs in last 24 hours: Temp:  [97.5 F (36.4 C)-99.5 F (37.5 C)] 97.9 F (36.6 C) (11/06 1006) Pulse Rate:  [96-113] 102 (11/06 1006) Resp:  [18] 18 (11/06 0542) BP: (98-119)/(61-70) 115/64 (11/06 1006) SpO2:  [92 %-100 %] 92 % (11/06 1006)  Intake/Output from previous day: 11/05 0701 - 11/06 0700 In: 480 [P.O.:480] Out: 791 [Urine:550; Stool:1; Chest Tube:240]  General appearance: alert, cooperative and no distress Heart: regular rate and rhythm Lungs: diminished breath sounds bibasilar Wound: clean and dry  Lab Results:  Recent Labs  02/16/16 0430 02/18/16 0118  WBC 13.3* 14.0*  HGB 7.5* 7.7*  HCT 22.5* 24.2*  PLT 273 637*   BMET:  Recent Labs  02/16/16 0430 02/18/16 0118  NA 136 140  K 3.9 4.3  CL 102 106  CO2 29 29  GLUCOSE 107* 113*  BUN 5* 11  CREATININE 0.68 0.76  CALCIUM 7.5* 7.7*    PT/INR: No results for input(s): LABPROT, INR in the last 72 hours. ABG    Component Value Date/Time   PHART 7.429 02/11/2016 0958   HCO3 24.6 02/11/2016 0958   TCO2 26 02/11/2016 0958   ACIDBASEDEF 1.0 02/10/2016 1038   O2SAT 99.0 02/11/2016 0958   CBG (last 3)  No results for input(s): GLUCAP in the last 72 hours.  Assessment/Plan: S/P Procedure(s) (LRB): EXPLORATORY LAPAROTOMY, Repair of diaphragm (N/A) CHEST TUBE INSERTION (Left) SPLENECTOMY  1. Bilateral Chest tube Placement by Trauma- minimal output, last value recorded was 400 on yesterday afternoon, no air leak 2. Pulm-CXR with stable appearance of bilateral pneumothoraces, R >L 3. Dispo- can possibly remove left chest tube today... However will defer to trauma    LOS: 8 days    BARRETT, ERIN 02/18/2016  No air  leak from tubes consider remove left chest tube today  I have seen and examined Colton Blair XXXLash and agree with the above assessment  and plan.  Delight OvensEdward B Macon Sandiford MD Beeper [phone] Office [phone] 02/18/2016 1:44 PM
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
## 2016-02-19 ENCOUNTER — Inpatient Hospital Stay (HOSPITAL_COMMUNITY): Payer: Self-pay

## 2016-02-19 DIAGNOSIS — S2243XB Multiple fractures of ribs, bilateral, initial encounter for open fracture: Secondary | ICD-10-CM | POA: Diagnosis present

## 2016-02-19 MED ORDER — HAEMOPHILUS B POLYSAC CONJ VAC IM SOLR
0.5000 mL | Freq: Once | INTRAMUSCULAR | Status: AC
  Administered 2016-02-19: 0.5 mL via INTRAMUSCULAR
  Filled 2016-02-19: qty 0.5

## 2016-02-19 MED ORDER — OXYCODONE-ACETAMINOPHEN 7.5-325 MG PO TABS: 1.0000 | ORAL_TABLET | ORAL | 0 refills | Status: DC | PRN

## 2016-02-19 MED ORDER — NAPROXEN 500 MG PO TABS: 500.0000 mg | ORAL_TABLET | Freq: Two times a day (BID) | ORAL | 0 refills | Status: DC

## 2016-02-19 MED ORDER — MENINGOCOCCAL A C Y&W-135 OLIG IM SOLR
0.5000 mL | Freq: Once | INTRAMUSCULAR | Status: AC
  Administered 2016-02-19: 0.5 mL via INTRAMUSCULAR
  Filled 2016-02-19: qty 0.5

## 2016-02-19 MED ORDER — PNEUMOCOCCAL 13-VAL CONJ VACC IM SUSP: 0.5000 mL | INTRAMUSCULAR | Status: DC

## 2016-02-19 MED ORDER — PNEUMOCOCCAL 13-VAL CONJ VACC IM SUSP
0.5000 mL | Freq: Once | INTRAMUSCULAR | Status: AC
  Administered 2016-02-19: 0.5 mL via INTRAMUSCULAR
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
## 2016-02-19 NOTE — Care Management Note (Signed)
Case Management Note  Patient Details  Name: Vidal SchwalbeKeaundre XXXLash MRN: [id number] Date of Birth: 09/15/1995  Subjective/Objective:   Pt medically stable for discharge home today.                  Action/Plan: Pt uninsured, but is eligible [facility].  [facility] of [facility] benefits.    Expected Discharge Date:    02/19/16              Expected Discharge Plan:  Home/Self Care  In-House Referral:     Discharge planning Services  CM Consult, MATCH Program, Medication Assistance  Post Acute Care Choice:    Choice offered to:     DME Arranged:    DME Agency:     HH Arranged:    HH Agency:     Status of Service:  Completed, signed off  If discussed at [employer] Meetings, dates discussed:    Additional Comments:  Quintella BatonJulie W. Devun Anna, RN, BSN  Trauma/Neuro ICU Case Manager [phone]
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
## 2016-02-19 NOTE — Discharge Instructions (Signed)
Remove chest dressings on Thursday then Wash wounds daily in shower with soap and water. Do not soak. Apply antibiotic ointment (e.g. Neosporin) twice daily and as needed to keep moist. Cover with dry dressing.  The bandages on your stomach incision will come off on their own. It is ok to shower with them. No lifting more than 10 pounds for 5 weeks.  No driving while taking oxycodone.  You should go to the health department in your county in 6 weeks to receive the 23-valent pneumococcal vaccine.

## 2016-02-20 ENCOUNTER — Emergency Department (HOSPITAL_COMMUNITY)
Admission: EM | Admit: 2016-02-20 | Discharge: 2016-02-20 | Disposition: A | Discharge status: Home / Self Care | Payer: Medicaid Other | Attending: Emergency Medicine | Admitting: Emergency Medicine

## 2016-02-20 ENCOUNTER — Encounter (HOSPITAL_COMMUNITY): Payer: Self-pay | Admitting: Emergency Medicine

## 2016-02-20 ENCOUNTER — Encounter (HOSPITAL_COMMUNITY): Payer: Self-pay

## 2016-02-20 DIAGNOSIS — F17210 Nicotine dependence, cigarettes, uncomplicated: Secondary | ICD-10-CM | POA: Insufficient documentation

## 2016-02-20 DIAGNOSIS — F419 Anxiety disorder, unspecified: Secondary | ICD-10-CM | POA: Insufficient documentation

## 2016-02-20 LAB — CBC WITH DIFFERENTIAL/PLATELET
Basophils Absolute: 0.1 10*3/uL (ref 0.0–0.1)
Basophils Relative: 0 %
EOS PCT: 2 %
Eosinophils Absolute: 0.6 10*3/uL (ref 0.0–0.7)
HEMATOCRIT: 31.2 % — AB (ref 39.0–52.0)
Hemoglobin: 9.5 g/dL — ABNORMAL LOW (ref 13.0–17.0)
LYMPHS ABS: 1.6 10*3/uL (ref 0.7–4.0)
LYMPHS PCT: 7 %
MCH: 28.2 pg (ref 26.0–34.0)
MCHC: 30.4 g/dL (ref 30.0–36.0)
MCV: 92.6 fL (ref 78.0–100.0)
MONO ABS: 2.3 10*3/uL — AB (ref 0.1–1.0)
MONOS PCT: 10 %
NEUTROS ABS: 19.1 10*3/uL — AB (ref 1.7–7.7)
Neutrophils Relative %: 81 %
PLATELETS: 1173 10*3/uL — AB (ref 150–400)
RBC: 3.37 MIL/uL — ABNORMAL LOW (ref 4.22–5.81)
RDW: 15.3 % (ref 11.5–15.5)
WBC: 23.6 10*3/uL — ABNORMAL HIGH (ref 4.0–10.5)

## 2016-02-20 LAB — BASIC METABOLIC PANEL
ANION GAP: 12 (ref 5–15)
BUN: 8 mg/dL (ref 6–20)
CALCIUM: 8.5 mg/dL — AB (ref 8.9–10.3)
CO2: 22 mmol/L (ref 22–32)
CREATININE: 0.89 mg/dL (ref 0.61–1.24)
Chloride: 105 mmol/L (ref 101–111)
GFR calc Af Amer: >60 mL/min (ref >60)
GFR calc non Af Amer: >60 mL/min (ref >60)
GLUCOSE: 79 mg/dL (ref 65–99)
Potassium: 4.7 mmol/L (ref 3.5–5.1)
Sodium: 139 mmol/L (ref 135–145)

## 2016-02-20 MED ORDER — LORAZEPAM 1 MG PO TABS
1.0000 mg | ORAL_TABLET | Freq: Once | ORAL | Status: AC
  Administered 2016-02-20: 1 mg via ORAL
  Filled 2016-02-20: qty 1

## 2016-02-20 MED ORDER — FERROUS SULFATE 325 (65 FE) MG PO TABS: 325.0000 mg | ORAL_TABLET | Freq: Every day | ORAL | 0 refills | Status: DC
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
## 2016-02-20 NOTE — ED Notes (Signed)
Pt states the medicine helped make him feel better.

## 2016-02-20 NOTE — ED Notes (Signed)
Began taking orthostatic VS on pt. Made it through the lying and sitting portion before the pt became upset about someone not coming back to see him. Pt began crying and saying, "I wish that I would have just died." I asked pt to stand up so that we could finish the orthostatic VS and he told me to just come back later. RN  Alerted.

## 2016-02-20 NOTE — ED Notes (Signed)
Pt had no complaints of being lightheaded or dizzy during orthostatic Vs. Pt breathing approximately 37 times/min. Still upset over some personal matter.

## 2016-02-20 NOTE — ED Notes (Signed)
Pt is crying, refusing to speak with nurse

## 2016-02-20 NOTE — Discharge Instructions (Signed)
Continue taking her medications as prescribed. I recommend taking your iron supplements as prescribed daily. Follow-up with the primary care office listed below to schedule a follow-up appointment in one week to have your blood work rechecked. Your hemoglobin today in the ED was 9.7 (which is improved from you hospital visit) and your platelets were 1,173.  Please return to the Emergency Department if symptoms worsen or new onset of fever, chest pain, lightheadedness, dizziness, shortness of breath, abdominal pain, vomiting, numbness, tingling, weakness, blood in urine or stool, syncope.

## 2016-02-20 NOTE — ED Notes (Signed)
Platelet level 1173 per lab. Joni ReiningNicole PA notified.

## 2016-02-22 LAB — PATHOLOGIST SMEAR REVIEW

## 2016-02-23 ENCOUNTER — Encounter (HOSPITAL_COMMUNITY): Payer: Self-pay

## 2016-02-23 ENCOUNTER — Emergency Department (HOSPITAL_COMMUNITY)
Admission: EM | Admit: 2016-02-23 | Discharge: 2016-02-23 | Disposition: A | Discharge status: Home / Self Care | Payer: Medicaid Other | Attending: Emergency Medicine | Admitting: Emergency Medicine

## 2016-02-23 DIAGNOSIS — F17210 Nicotine dependence, cigarettes, uncomplicated: Secondary | ICD-10-CM | POA: Insufficient documentation

## 2016-02-23 DIAGNOSIS — Y658 Other specified misadventures during surgical and medical care: Secondary | ICD-10-CM | POA: Insufficient documentation

## 2016-02-23 DIAGNOSIS — T8132XA Disruption of internal operation (surgical) wound, not elsewhere classified, initial encounter: Secondary | ICD-10-CM | POA: Insufficient documentation

## 2016-02-23 DIAGNOSIS — T8130XA Disruption of wound, unspecified, initial encounter: Secondary | ICD-10-CM
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
## 2016-02-25 ENCOUNTER — Encounter (HOSPITAL_COMMUNITY): Payer: Self-pay | Admitting: Nurse Practitioner

## 2016-02-25 ENCOUNTER — Emergency Department (HOSPITAL_COMMUNITY)
Admission: EM | Admit: 2016-02-25 | Discharge: 2016-02-25 | Disposition: A | Discharge status: Home / Self Care | Payer: Medicaid Other | Attending: Emergency Medicine | Admitting: Emergency Medicine

## 2016-02-25 DIAGNOSIS — T8131XA Disruption of external operation (surgical) wound, not elsewhere classified, initial encounter: Secondary | ICD-10-CM | POA: Insufficient documentation

## 2016-02-25 DIAGNOSIS — Y733 Surgical instruments, materials and gastroenterology and urology devices (including sutures) associated with adverse incidents: Secondary | ICD-10-CM | POA: Insufficient documentation

## 2016-02-25 DIAGNOSIS — F17210 Nicotine dependence, cigarettes, uncomplicated: Secondary | ICD-10-CM | POA: Insufficient documentation
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
## 2016-02-25 NOTE — ED Notes (Signed)
Placed patient into a gown waiting on provider 
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
## 2016-03-13 ENCOUNTER — Telehealth (HOSPITAL_COMMUNITY): Payer: Self-pay

## 2016-03-13 DIAGNOSIS — R0602 Shortness of breath: Secondary | ICD-10-CM
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
## 2016-03-17 ENCOUNTER — Encounter (HOSPITAL_COMMUNITY): Payer: Self-pay | Admitting: Emergency Medicine

## 2016-03-17 ENCOUNTER — Emergency Department (HOSPITAL_COMMUNITY)
Admission: EM | Admit: 2016-03-17 | Discharge: 2016-03-18 | Disposition: A | Discharge status: Home / Self Care | Payer: Self-pay | Attending: Emergency Medicine | Admitting: Emergency Medicine

## 2016-03-17 ENCOUNTER — Emergency Department (HOSPITAL_COMMUNITY): Payer: Self-pay

## 2016-03-17 DIAGNOSIS — R0609 Other forms of dyspnea: Secondary | ICD-10-CM

## 2016-03-17 DIAGNOSIS — Z87891 Personal history of nicotine dependence: Secondary | ICD-10-CM | POA: Insufficient documentation

## 2016-03-17 DIAGNOSIS — G8918 Other acute postprocedural pain: Secondary | ICD-10-CM | POA: Insufficient documentation

## 2016-03-17 DIAGNOSIS — R0600 Dyspnea, unspecified: Secondary | ICD-10-CM | POA: Insufficient documentation

## 2016-03-17 HISTORY — DX: Accidental discharge from unspecified firearms or gun, initial encounter: W34.00XA

## 2016-03-17 LAB — CBC
HEMATOCRIT: 35.2 % — AB (ref 39.0–52.0)
HEMOGLOBIN: 10.7 g/dL — AB (ref 13.0–17.0)
MCH: 25.5 pg — AB (ref 26.0–34.0)
MCHC: 30.4 g/dL (ref 30.0–36.0)
MCV: 83.8 fL (ref 78.0–100.0)
Platelets: 668 10*3/uL — ABNORMAL HIGH (ref 150–400)
RBC: 4.2 MIL/uL — ABNORMAL LOW (ref 4.22–5.81)
RDW: 16.7 % — ABNORMAL HIGH (ref 11.5–15.5)
WBC: 15.7 10*3/uL — ABNORMAL HIGH (ref 4.0–10.5)

## 2016-03-17 LAB — COMPREHENSIVE METABOLIC PANEL
ALBUMIN: 3.6 g/dL (ref 3.5–5.0)
ALK PHOS: 77 U/L (ref 38–126)
ALT: 14 U/L — ABNORMAL LOW (ref 17–63)
ANION GAP: 9 (ref 5–15)
AST: 19 U/L (ref 15–41)
BUN: 12 mg/dL (ref 6–20)
CALCIUM: 8.6 mg/dL — AB (ref 8.9–10.3)
CHLORIDE: 103 mmol/L (ref 101–111)
CO2: 26 mmol/L (ref 22–32)
Creatinine, Ser: 1.03 mg/dL (ref 0.61–1.24)
GFR calc Af Amer: >60 mL/min (ref >60)
GFR calc non Af Amer: >60 mL/min (ref >60)
GLUCOSE: 108 mg/dL — AB (ref 65–99)
POTASSIUM: 3.9 mmol/L (ref 3.5–5.1)
SODIUM: 138 mmol/L (ref 135–145)
Total Bilirubin: 0.6 mg/dL (ref 0.3–1.2)
Total Protein: 7.6 g/dL (ref 6.5–8.1)

## 2016-03-17 LAB — LIPASE, BLOOD: LIPASE: 25 U/L (ref 11–51)
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
## 2016-03-17 NOTE — ED Notes (Signed)
Patient in radiology

## 2016-03-17 NOTE — ED Notes (Signed)
Pt is aware urine sample is needed. 

## 2016-03-18 LAB — URINE MICROSCOPIC-ADD ON
RBC / HPF: NONE SEEN RBC/hpf (ref 0–5)
SQUAMOUS EPITHELIAL / LPF: NONE SEEN

## 2016-03-18 LAB — URINALYSIS, ROUTINE W REFLEX MICROSCOPIC
Glucose, UA: NEGATIVE mg/dL
HGB URINE DIPSTICK: NEGATIVE
Ketones, ur: NEGATIVE mg/dL
Nitrite: NEGATIVE
PROTEIN: 30 mg/dL — AB
Specific Gravity, Urine: 1.037 — ABNORMAL HIGH (ref 1.005–1.030)
pH: 5.5 (ref 5.0–8.0)

## 2016-03-18 MED ORDER — NAPROXEN 500 MG PO TABS: 500.0000 mg | ORAL_TABLET | Freq: Two times a day (BID) | ORAL | 0 refills | Status: DC

## 2016-03-18 MED ORDER — HYDROCODONE-ACETAMINOPHEN 5-325 MG PO TABS: 1.0000 | ORAL_TABLET | Freq: Four times a day (QID) | ORAL | 0 refills | Status: DC | PRN
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
## 2016-03-18 NOTE — Discharge Instructions (Signed)
Follow up with the trauma clinic. Take Naproxen for mild to moderate pain and Norco for severe pain, as needed.

## 2016-03-24 ENCOUNTER — Telehealth (HOSPITAL_COMMUNITY): Payer: Self-pay

## 2016-03-26 NOTE — Telephone Encounter (Signed)
NA, unable to leave message

## 2016-03-27 NOTE — Telephone Encounter (Signed)
NA, unable to leave message

## 2016-04-16 ENCOUNTER — Telehealth: Payer: Self-pay | Admitting: General Practice
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
## 2016-04-17 ENCOUNTER — Ambulatory Visit (INDEPENDENT_AMBULATORY_CARE_PROVIDER_SITE_OTHER): Payer: Self-pay | Admitting: Internal Medicine

## 2016-04-17 ENCOUNTER — Encounter (INDEPENDENT_AMBULATORY_CARE_PROVIDER_SITE_OTHER): Payer: Self-pay

## 2016-04-17 VITALS — BP 97/65 | HR 88 | Temp 98.4°F | Ht 71.0 in | Wt 149.8 lb

## 2016-04-17 DIAGNOSIS — Z87828 Personal history of other (healed) physical injury and trauma: Secondary | ICD-10-CM

## 2016-04-17 DIAGNOSIS — F329 Major depressive disorder, single episode, unspecified: Secondary | ICD-10-CM

## 2016-04-17 DIAGNOSIS — F32A Depression, unspecified: Secondary | ICD-10-CM

## 2016-04-17 DIAGNOSIS — Z09 Encounter for follow-up examination after completed treatment for conditions other than malignant neoplasm: Secondary | ICD-10-CM

## 2016-04-17 DIAGNOSIS — F418 Other specified anxiety disorders: Secondary | ICD-10-CM

## 2016-04-17 DIAGNOSIS — R011 Cardiac murmur, unspecified: Secondary | ICD-10-CM

## 2016-04-17 DIAGNOSIS — Z23 Encounter for immunization: Secondary | ICD-10-CM

## 2016-04-17 DIAGNOSIS — F419 Anxiety disorder, unspecified: Secondary | ICD-10-CM

## 2016-04-17 DIAGNOSIS — Z8249 Family history of ischemic heart disease and other diseases of the circulatory system: Secondary | ICD-10-CM

## 2016-04-17 DIAGNOSIS — Z803 Family history of malignant neoplasm of breast: Secondary | ICD-10-CM

## 2016-04-17 DIAGNOSIS — Z598 Other problems related to housing and economic circumstances: Secondary | ICD-10-CM

## 2016-04-17 DIAGNOSIS — Z87891 Personal history of nicotine dependence: Secondary | ICD-10-CM

## 2016-04-17 DIAGNOSIS — Z833 Family history of diabetes mellitus: Secondary | ICD-10-CM

## 2016-04-17 DIAGNOSIS — Z5989 Other problems related to housing and economic circumstances: Secondary | ICD-10-CM

## 2016-04-17 DIAGNOSIS — Z9081 Acquired absence of spleen: Secondary | ICD-10-CM

## 2016-04-17 DIAGNOSIS — Z836 Family history of other diseases of the respiratory system: Secondary | ICD-10-CM

## 2016-04-17 MED ORDER — IBUPROFEN 600 MG PO TABS: 600.0000 mg | ORAL_TABLET | Freq: Four times a day (QID) | ORAL | 2 refills | Status: DC | PRN

## 2016-04-17 MED ORDER — MENINGOCOCCAL VAC B (OMV) IM SUSY: 0.5000 mL | PREFILLED_SYRINGE | Freq: Once | INTRAMUSCULAR | 0 refills | Status: AC

## 2016-04-17 MED ORDER — MENINGOCOCCAL VAC A,C,Y,W-135 ~~LOC~~ INJ: 0.5000 mL | INJECTION | Freq: Once | SUBCUTANEOUS | 0 refills | Status: AC

## 2016-04-17 NOTE — Patient Instructions (Addendum)
Colton Blair,   It was a pleasure to meet you today. For your pain you can alternated the ibuprofen and tylenol as needed every 3 hours.   The two vaccines that we don't carry you can get at the health department. I have sent in the prescription to them. Please go and get those done as soon as you are able.  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 If you have any fevers, chills, etc it will be important to give us a call and let us know.   I would like you to follow up in 1-2 months once you are able to get your [employer].    Long-Term Care After a Splenectomy A splenectomy is surgery to remove a diseased or injured spleen. The spleen is an organ that is located in the upper left part of the abdomen, just under the ribs. The spleen filters and cleans the blood. It also stores blood cells and destroys cells that are worn out. The spleen is also important for fighting disease. The spleen, along with other body systems and organs, plays an important role in the body's natural defense system (immune system). After your spleen is removed, you have a slightly greater chance of developing a serious, life-threatening infection. The following are some actions that you can take to prevent infection. How can I prevent infection? Your health care provider will recommend actions to help prevent infection. These may include:  Making sure that your immunizations are up-to-date, including:  Pneumococcus.  Seasonal flu (influenza).  Hib (Haemophilus influenzae type b).  Meningitis.  Making sure that vaccines are up-to-date for your family members.  Following good daily practices to prevent infection, such as:  Washing your hands often, especially after preparing food, eating, changing diapers, and playing with children or animals.  Disinfecting surfaces regularly.  Avoiding people who have active illness or infections.  Taking precautions to avoid  insect bites, such as:  Wearing proper clothing that covers the entire body when you are in wooded or marshy areas.  Changing clothing right away and checking for bites after you have been outside.  Using insect spray.  Using insect netting.  Staying indoors during hours when mosquitoes are most active.  Taking precautions to avoid dog bites.  After a splenectomy, you may be at increased risk for rare infections that are associated with dog bites. What do I need to do if I must travel? If you travel in the [place] take actions to avoid insect bites, especially in [place] areas. Insects can carry many viruses, and you may be at an increased risk of becoming sick from these viruses. You should also take precautions if you travel abroad to places where malaria is common. In that case, follow these guidelines:  Contact your health care provider to get specific advice about the places that you will be visiting.  Get specific immunizations to guard against the disease risks in the country that you will be visiting.  Understand how to prevent infections, such as malaria, while you are abroad. These infections can pose serious risk. Precautions may include:  Daily tablets to prevent malaria.  Taking other precautions to prevent insect bites.  Bring broad-spectrum antibiotic medicines with you if they have been prescribed. What other things do I need to remember to do?  Take over-the-counter and prescription medicines only as told by your health care provider.  If you were prescribed an antibiotic:  Take it as told by your health care provider.  Do not stop taking the antibiotic even if you start to feel better.  Talk with your health care provider about the use of a probiotic supplement to prevent stomach upset.  Keep track of medicine refills so you do not run out of medicine.  Inform your close contacts of your condition. Consider wearing a medical alert  bracelet or carrying an ID card.  Keep all follow-up visits as told by your health care provider. This is important. When should I seek medical care? Call your health care provider if:  You have a fever.  You have signs of infection that continue after taking an antibiotic. Signs may include chills and feeling unwell.  You are considering travel abroad.  You are bitten by a tick or a dog. When should I seek immediate medical care? Call 911 or go to the emergency department if:  You have chest pain along with:  Shortness of breath.  Pain in the back, neck, or jaw.  You have pain or swelling in the leg.  You develop a sudden headache and dizziness. This information is not intended to replace advice given to you by your health care provider. Make sure you discuss any questions you have with your health care provider. Document Released: 09/18/2009 Document Revised: 09/03/2015 Document Reviewed: 12/12/2014 Elsevier Interactive Patient Education  2017 [employer].
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
## 2016-04-18 DIAGNOSIS — Z5989 Other problems related to housing and economic circumstances: Secondary | ICD-10-CM | POA: Insufficient documentation

## 2016-04-18 DIAGNOSIS — F329 Major depressive disorder, single episode, unspecified: Secondary | ICD-10-CM | POA: Insufficient documentation

## 2016-04-18 DIAGNOSIS — F419 Anxiety disorder, unspecified: Secondary | ICD-10-CM

## 2016-04-18 DIAGNOSIS — Z598 Other problems related to housing and economic circumstances: Secondary | ICD-10-CM | POA: Insufficient documentation

## 2016-04-18 DIAGNOSIS — R011 Cardiac murmur, unspecified: Secondary | ICD-10-CM | POA: Insufficient documentation

## 2016-04-18 NOTE — Assessment & Plan Note (Signed)
Patient reports a history of cardiac surgery as an infant. 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 Unclear what kind of surgery he had at that point. Does sound as if he had a congential heart defect but not clear what kind.  Does have a 3/6 harsh systolic murmur heard throughout but best at LUSB with a well healed sternotomy scar.  Denies any chest pain, shortness of breath or other symptoms today.  Plan Will evaluated with ECHO once patient is able [facility]

## 2016-04-18 NOTE — Addendum Note (Signed)
Addended by: Hollie SalkBOSWELL, Yaffa Seckman L on: 04/18/2016 11:37 AM   Modules accepted: Orders

## 2016-04-18 NOTE — Progress Notes (Signed)
Internal Medicine Clinic Attending  Case discussed with Dr. Boswell at the time of the visit.  We reviewed the resident's history and exam and pertinent patient test results.  I agree with the assessment, diagnosis, and plan of care documented in the resident's note.  
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
## 2016-04-18 NOTE — Assessment & Plan Note (Signed)
Working on meeting with Colton Blair to get the [employer]

## 2016-04-21 ENCOUNTER — Telehealth (HOSPITAL_COMMUNITY): Payer: Self-pay

## 2016-04-22 NOTE — Telephone Encounter (Signed)
Left message to call back  

## 2016-05-07 ENCOUNTER — Encounter: Payer: Self-pay | Admitting: *Deleted

## 2016-05-21 ENCOUNTER — Telehealth (HOSPITAL_COMMUNITY): Payer: Self-pay

## 2016-06-06 NOTE — Addendum Note (Signed)
Addended by: Neomia DearPOWERS, Neidra Girvan E on: 06/06/2016 04:29 PM   Modules accepted: Orders

## 2016-06-17 ENCOUNTER — Telehealth: Payer: Self-pay | Admitting: Internal Medicine

## 2016-06-17 NOTE — Telephone Encounter (Signed)
APT. REMINDER CALL, LMTCB °

## 2016-06-18 ENCOUNTER — Encounter: Payer: Self-pay | Admitting: Internal Medicine

## 2016-06-18 ENCOUNTER — Ambulatory Visit (INDEPENDENT_AMBULATORY_CARE_PROVIDER_SITE_OTHER): Payer: Self-pay | Admitting: Internal Medicine

## 2016-06-18 DIAGNOSIS — F418 Other specified anxiety disorders: Secondary | ICD-10-CM

## 2016-06-18 DIAGNOSIS — F329 Major depressive disorder, single episode, unspecified: Secondary | ICD-10-CM

## 2016-06-18 DIAGNOSIS — Z5989 Other problems related to housing and economic circumstances: Secondary | ICD-10-CM

## 2016-06-18 DIAGNOSIS — R45851 Suicidal ideations: Secondary | ICD-10-CM

## 2016-06-18 DIAGNOSIS — F419 Anxiety disorder, unspecified: Secondary | ICD-10-CM

## 2016-06-18 DIAGNOSIS — Z9081 Acquired absence of spleen: Secondary | ICD-10-CM

## 2016-06-18 DIAGNOSIS — Z87828 Personal history of other (healed) physical injury and trauma: Secondary | ICD-10-CM

## 2016-06-18 DIAGNOSIS — Z598 Other problems related to housing and economic circumstances: Secondary | ICD-10-CM

## 2016-06-18 MED ORDER — CITALOPRAM HYDROBROMIDE 20 MG PO TABS: 20.0000 mg | ORAL_TABLET | Freq: Every day | ORAL | 2 refills | Status: DC
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
## 2016-06-18 NOTE — Patient Instructions (Addendum)
Ms. Colton Blair,  I am going to start you on a new medication today called Citalopram/Celxa. It wilCatha Nottinghaml take about 4-6 weeks before you start to see the benefits of the medication.   I would like you to follow up with [facility]. [address], [place], [place] [address]. [phone].   Please follow up with me in 4-6 weeks.
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
## 2016-06-20 NOTE — Assessment & Plan Note (Signed)
Mr. Colton Blair has not followed up with the trauma surgeons. Wound today is well healed with no signs of infection. Abdominal exam benign.   He did not follow up and get the rest of his vaccinations after our last visit. Still needs MenB and MenACWY vaccines.   Denies any fever, chills, abdominal pain, diarrhea, shortness of breath, cough.   Assessment: status post splenectomy  Plan:  Encouraged to get vaccinations Encouraged follow up with trauma surgeons

## 2016-06-20 NOTE — Assessment & Plan Note (Signed)
Has not gotten [employer] yet. [facility].

## 2016-06-25 NOTE — Progress Notes (Signed)
Internal Medicine Clinic Attending  Case discussed with Dr. Boswell at the time of the visit.  We reviewed the resident's history and exam and pertinent patient test results.  I agree with the assessment, diagnosis, and plan of care documented in the resident's note.  

## 2016-08-05 ENCOUNTER — Telehealth: Payer: Self-pay | Admitting: Internal Medicine
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
## 2016-08-06 ENCOUNTER — Encounter: Payer: Self-pay | Admitting: Internal Medicine

## 2017-10-11 ENCOUNTER — Encounter: Payer: Self-pay | Admitting: *Deleted
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
## 2018-05-12 ENCOUNTER — Emergency Department (HOSPITAL_COMMUNITY): Payer: Self-pay

## 2018-05-12 ENCOUNTER — Emergency Department (HOSPITAL_COMMUNITY)
Admission: EM | Admit: 2018-05-12 | Discharge: 2018-05-12 | Disposition: A | Discharge status: Home / Self Care | Payer: Self-pay | Attending: Emergency Medicine | Admitting: Emergency Medicine

## 2018-05-12 ENCOUNTER — Other Ambulatory Visit: Payer: Self-pay

## 2018-05-12 ENCOUNTER — Encounter (HOSPITAL_COMMUNITY): Payer: Self-pay | Admitting: Emergency Medicine

## 2018-05-12 DIAGNOSIS — F17210 Nicotine dependence, cigarettes, uncomplicated: Secondary | ICD-10-CM | POA: Insufficient documentation

## 2018-05-12 DIAGNOSIS — N453 Epididymo-orchitis: Secondary | ICD-10-CM | POA: Insufficient documentation

## 2018-05-12 LAB — URINALYSIS, ROUTINE W REFLEX MICROSCOPIC
Bilirubin Urine: NEGATIVE
GLUCOSE, UA: NEGATIVE mg/dL
Hgb urine dipstick: NEGATIVE
Ketones, ur: NEGATIVE mg/dL
Nitrite: NEGATIVE
PROTEIN: NEGATIVE mg/dL
Specific Gravity, Urine: 1.024 (ref 1.005–1.030)
pH: 5 (ref 5.0–8.0)

## 2018-05-12 LAB — CBC WITH DIFFERENTIAL/PLATELET
Abs Immature Granulocytes: 0.08 10*3/uL — ABNORMAL HIGH (ref 0.00–0.07)
Basophils Absolute: 0.1 10*3/uL (ref 0.0–0.1)
Basophils Relative: 1 %
EOS ABS: 0.2 10*3/uL (ref 0.0–0.5)
Eosinophils Relative: 2 %
HEMATOCRIT: 49.4 % (ref 39.0–52.0)
Hemoglobin: 15.7 g/dL (ref 13.0–17.0)
Immature Granulocytes: 1 %
LYMPHS ABS: 2.5 10*3/uL (ref 0.7–4.0)
Lymphocytes Relative: 17 %
MCH: 31.3 pg (ref 26.0–34.0)
MCHC: 31.8 g/dL (ref 30.0–36.0)
MCV: 98.4 fL (ref 80.0–100.0)
Monocytes Absolute: 1.6 10*3/uL — ABNORMAL HIGH (ref 0.1–1.0)
Monocytes Relative: 11 %
Neutro Abs: 10.5 10*3/uL — ABNORMAL HIGH (ref 1.7–7.7)
Neutrophils Relative %: 68 %
Platelets: 330 10*3/uL (ref 150–400)
RBC: 5.02 MIL/uL (ref 4.22–5.81)
RDW: 12.8 % (ref 11.5–15.5)
WBC: 15 10*3/uL — ABNORMAL HIGH (ref 4.0–10.5)
nRBC: 0 % (ref 0.0–0.2)

## 2018-05-12 LAB — BASIC METABOLIC PANEL
Anion gap: 7 (ref 5–15)
BUN: 6 mg/dL (ref 6–20)
CALCIUM: 9.1 mg/dL (ref 8.9–10.3)
CO2: 28 mmol/L (ref 22–32)
CREATININE: 1.18 mg/dL (ref 0.61–1.24)
Chloride: 106 mmol/L (ref 98–111)
GFR calc Af Amer: >60 mL/min (ref >60)
GFR calc non Af Amer: >60 mL/min (ref >60)
Glucose, Bld: 96 mg/dL (ref 70–99)
Potassium: 4 mmol/L (ref 3.5–5.1)
Sodium: 141 mmol/L (ref 135–145)

## 2018-05-12 MED ORDER — CEFTRIAXONE SODIUM 250 MG IJ SOLR
250.0000 mg | Freq: Once | INTRAMUSCULAR | Status: AC
  Administered 2018-05-12: 250 mg via INTRAMUSCULAR
  Filled 2018-05-12: qty 250

## 2018-05-12 MED ORDER — AZITHROMYCIN 250 MG PO TABS
1000.0000 mg | ORAL_TABLET | Freq: Once | ORAL | Status: AC
  Administered 2018-05-12: 1000 mg via ORAL
  Filled 2018-05-12: qty 4

## 2018-05-12 MED ORDER — HYDROCODONE-ACETAMINOPHEN 5-325 MG PO TABS: 1.0000 | ORAL_TABLET | ORAL | 0 refills | Status: DC | PRN

## 2018-05-12 MED ORDER — DOXYCYCLINE HYCLATE 100 MG PO TABS
100.0000 mg | ORAL_TABLET | Freq: Once | ORAL | Status: AC
  Administered 2018-05-12: 100 mg via ORAL
  Filled 2018-05-12: qty 1

## 2018-05-12 MED ORDER — DOXYCYCLINE HYCLATE 100 MG PO CAPS: 100.0000 mg | ORAL_CAPSULE | Freq: Two times a day (BID) | ORAL | 0 refills | Status: DC

## 2018-05-12 MED ORDER — STERILE WATER FOR INJECTION IJ SOLN
INTRAMUSCULAR | Status: AC
  Administered 2018-05-12: 10 mL
  Filled 2018-05-12: qty 10

## 2018-05-12 NOTE — ED Notes (Signed)
Pt in [place]

## 2018-05-12 NOTE — Discharge Instructions (Addendum)
Take doxycycline as prescribed and complete the full course. Take Norco as needed as prescribed for pain. Go to [facility] emergency room or return to ER for worsening pain or swelling, fevers or other concerns.  Otherwise you need to follow-up with urology, give their office a call to schedule an appointment.  Abstain from intercourse until you know your test results. Call the hospital in 3-5 days for your results. IF your gonorrhea and chlamydia tests are negative, you may resume intercourse. IF either test is positive, your partner will need to go to the health department for free treatment. IF your test is positive, both partners need to abstain from intercourse for 10 days after treatment (this includes all forms of intercourse).

## 2018-05-12 NOTE — ED Triage Notes (Addendum)
C/o R testicle pain and swelling x 2 days.  Reports brown discharge.  Also c/o intermittent burning with urination.
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
## 2018-05-13 LAB — GC/CHLAMYDIA PROBE AMP (~~LOC~~) NOT AT ARMC
Chlamydia: POSITIVE — AB
Neisseria Gonorrhea: NEGATIVE

## 2018-05-14 LAB — URINE CULTURE: Culture: NO GROWTH

## 2018-10-07 ENCOUNTER — Observation Stay (HOSPITAL_COMMUNITY)
Admission: RE | Admit: 2018-10-07 | Discharge: 2018-10-08 | Disposition: A | Discharge status: Home / Self Care | Payer: Self-pay | Attending: Psychiatry | Admitting: Psychiatry

## 2018-10-07 ENCOUNTER — Other Ambulatory Visit: Payer: Self-pay

## 2018-10-07 DIAGNOSIS — F17210 Nicotine dependence, cigarettes, uncomplicated: Secondary | ICD-10-CM | POA: Insufficient documentation

## 2018-10-07 DIAGNOSIS — F4310 Post-traumatic stress disorder, unspecified: Principal | ICD-10-CM | POA: Insufficient documentation

## 2018-10-07 DIAGNOSIS — G4700 Insomnia, unspecified: Secondary | ICD-10-CM | POA: Insufficient documentation

## 2018-10-07 DIAGNOSIS — F1994 Other psychoactive substance use, unspecified with psychoactive substance-induced mood disorder: Secondary | ICD-10-CM | POA: Diagnosis present

## 2018-10-07 DIAGNOSIS — R45850 Homicidal ideations: Secondary | ICD-10-CM | POA: Insufficient documentation

## 2018-10-07 DIAGNOSIS — Z590 Homelessness: Secondary | ICD-10-CM | POA: Insufficient documentation

## 2018-10-07 DIAGNOSIS — F1020 Alcohol dependence, uncomplicated: Secondary | ICD-10-CM | POA: Insufficient documentation

## 2018-10-07 DIAGNOSIS — F319 Bipolar disorder, unspecified: Secondary | ICD-10-CM | POA: Insufficient documentation

## 2018-10-07 MED ORDER — ALUM & MAG HYDROXIDE-SIMETH 200-200-20 MG/5ML PO SUSP
30.0000 mL | ORAL | Status: DC | PRN
  Filled 2018-10-07: qty 30

## 2018-10-07 MED ORDER — LORAZEPAM 1 MG PO TABS: 1.0000 mg | ORAL_TABLET | Freq: Three times a day (TID) | ORAL | Status: DC

## 2018-10-07 MED ORDER — LORAZEPAM 1 MG PO TABS
1.0000 mg | ORAL_TABLET | Freq: Four times a day (QID) | ORAL | Status: DC | PRN
  Filled 2018-10-07: qty 1

## 2018-10-07 MED ORDER — HYDROXYZINE HCL 25 MG PO TABS
25.0000 mg | ORAL_TABLET | Freq: Four times a day (QID) | ORAL | Status: DC | PRN
  Filled 2018-10-07: qty 1

## 2018-10-07 MED ORDER — VITAMIN B-1 100 MG PO TABS
100.0000 mg | ORAL_TABLET | Freq: Every day | ORAL | Status: DC
  Administered 2018-10-08: 100 mg via ORAL
  Filled 2018-10-07 (×3): qty 1

## 2018-10-07 MED ORDER — THIAMINE HCL 100 MG/ML IJ SOLN
100.0000 mg | Freq: Once | INTRAMUSCULAR | Status: DC
  Filled 2018-10-07: qty 1

## 2018-10-07 MED ORDER — LOPERAMIDE HCL 2 MG PO CAPS
2.0000 mg | ORAL_CAPSULE | ORAL | Status: DC | PRN
  Filled 2018-10-07: qty 2

## 2018-10-07 MED ORDER — LORAZEPAM 1 MG PO TABS: 1.0000 mg | ORAL_TABLET | Freq: Two times a day (BID) | ORAL | Status: DC

## 2018-10-07 MED ORDER — LORAZEPAM 1 MG PO TABS: 1.0000 mg | ORAL_TABLET | Freq: Every day | ORAL | Status: DC

## 2018-10-07 MED ORDER — LORAZEPAM 1 MG PO TABS
1.0000 mg | ORAL_TABLET | Freq: Four times a day (QID) | ORAL | Status: DC
  Administered 2018-10-07 – 2018-10-08 (×2): 1 mg via ORAL
  Filled 2018-10-07 (×8): qty 1

## 2018-10-07 MED ORDER — ACETAMINOPHEN 325 MG PO TABS
650.0000 mg | ORAL_TABLET | Freq: Four times a day (QID) | ORAL | Status: DC | PRN
  Filled 2018-10-07: qty 2

## 2018-10-07 MED ORDER — ADULT MULTIVITAMIN W/MINERALS CH
1.0000 | ORAL_TABLET | Freq: Every day | ORAL | Status: DC
  Administered 2018-10-08: 1 via ORAL
  Filled 2018-10-07 (×5): qty 1

## 2018-10-07 MED ORDER — ONDANSETRON 4 MG PO TBDP
4.0000 mg | ORAL_TABLET | Freq: Four times a day (QID) | ORAL | Status: DC | PRN
  Filled 2018-10-07: qty 1

## 2018-10-07 MED ORDER — LORAZEPAM 1 MG PO TABS
1.0000 mg | ORAL_TABLET | Freq: Once | ORAL | Status: AC
  Administered 2018-10-07: 21:00:00 1 mg via ORAL
  Filled 2018-10-07 (×2): qty 1

## 2018-10-07 MED ORDER — MAGNESIUM HYDROXIDE 400 MG/5ML PO SUSP
30.0000 mL | Freq: Every day | ORAL | Status: DC | PRN
  Filled 2018-10-07: qty 30

## 2018-10-07 NOTE — H&P (Signed)
Behavioral Health Medical Screening Exam  Colton Blair is an 23 y.o. male.  Psychiatric Specialty Exam: Physical Exam  Constitutional: He is oriented to person, place, and time. He appears well-developed and well-nourished. No distress.  HENT:  Head: Normocephalic and atraumatic.  Right Ear: External ear normal.  Left Ear: External ear normal.  Eyes: Pupils are equal, round, and reactive to light. Right eye exhibits no discharge. Left eye exhibits no discharge. No scleral icterus.  Respiratory: Effort normal. No respiratory distress.  Musculoskeletal: Normal range of motion.  Neurological: He is alert and oriented to person, place, and time.  Skin: He is not diaphoretic.  Psychiatric: His mood appears anxious. He is not withdrawn and not actively hallucinating. Thought content is not paranoid and not delusional. He expresses impulsivity and inappropriate judgment. He expresses homicidal ideation. He expresses no suicidal ideation. He expresses no homicidal plans.    Review of Systems  Constitutional: Negative for chills, diaphoresis, fever, malaise/fatigue and weight loss.  Respiratory: Negative for cough and shortness of breath.   Cardiovascular: Negative for chest pain.  Gastrointestinal: Negative for diarrhea, nausea and vomiting.  Psychiatric/Behavioral: Positive for depression and substance abuse. Negative for hallucinations, memory loss and suicidal ideas. The patient is nervous/anxious and has insomnia.   All other systems reviewed and are negative.   There were no vitals taken for this visit.There is no height or weight on file to calculate BMI.  General Appearance: Casual and Well Groomed  Eye Contact:  Fair  Speech:  Clear and Coherent and Normal Rate  Volume:  Increased  Mood:  Anxious and Irritable  Affect:  Congruent  Thought Process:  Coherent and Descriptions of Associations: Intact  Orientation:  Full (Time, Place, and Person)  Thought Content:  Logical and  Hallucinations: None  Suicidal Thoughts:  No  Homicidal Thoughts:  Yes.  without intent/plan  Memory:  Immediate;   Good Recent;   Fair  Judgement:  Impaired  Insight:  Lacking  Psychomotor Activity:  Restlessness  Concentration:  Concentration: Fair and Attention Span: Fair  Recall:  Good  Fund of Knowledge:  Good  Language:  Good  Akathisia:  Negative  Handed:  Right  AIMS (if indicated):     Assets:  Communication Skills Intimacy Leisure Time Physical Health  ADL's:  Intact  Cognition:  WNL  Sleep:        Blood pressure 133/85, pulse 95, temperature 99.1 F (37.3 C), temperature source Oral, resp. rate 20, SpO2 100 %.  Recommendations:  Based on my evaluation the patient does not appear to have an emergency medical condition.  Rozetta Nunnery, NP 10/07/2018, 9:03 PM

## 2018-10-07 NOTE — Progress Notes (Signed)
Patient ID: Colton Blair, male   DOB: 1995/07/01, 23 y.o.   MRN: [id number] Pt A&O x 3, no distress noted, cooperative, but anxious and irritable, guarded behavior.  Pt presents with anger issues and history of PTSD.  Denies SI, HI or AVH.  Monitoring for safety.
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
## 2018-10-07 NOTE — Plan of Care (Signed)
[place] CRISIS PLAN  Reason for Crisis Plan:  [name] Observation   Plan of Care:  Crisis Stabilization  Family Support:      Current Living Environment:  Living Arrangements: Other (Comment)(Will stay with friends here and there)  Insurance:   Hospital Account    Name Acct ID Class Status Primary Coverage   Colton Blair, Colton Blair [id number] Colton Blair Open None        Guarantor Account (for Hospital Account [id number])    Name Relation to Pt Service Area Active? Acct Type   Colton Blair D Self [facility] Yes Behavioral Health   Address Phone       [address]  [place], [place] [address] [phone])          Coverage Information (for Hospital Account [id number])    Not on file      Legal Guardian:     Primary Care Provider:  Patient, No Pcp Per  Current Outpatient Providers:  Colton Covert, MD  Psychiatrist:  Name of Psychiatrist: None  Counselor/Therapist:  Name of Therapist: None  Compliant with Medications:  Yes  Additional Information:   Colton Blair 6/25/202010:38 PM
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
## 2018-10-08 NOTE — Progress Notes (Signed)
Patient ID: Colton Blair, male   DOB: 1995-06-11, 23 y.o.   MRN: [id number] Patient discharged to home/self care on his own accord patient acknowledged understanding of all discharge instructions and receipt of all personal belongings.
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
## 2019-03-14 ENCOUNTER — Encounter (HOSPITAL_COMMUNITY): Payer: Self-pay | Admitting: Emergency Medicine

## 2019-03-14 ENCOUNTER — Emergency Department (HOSPITAL_COMMUNITY)
Admission: EM | Admit: 2019-03-14 | Discharge: 2019-03-14 | Disposition: A | Discharge status: Home / Self Care | Payer: Self-pay | Attending: Emergency Medicine | Admitting: Emergency Medicine

## 2019-03-14 ENCOUNTER — Other Ambulatory Visit: Payer: Self-pay

## 2019-03-14 DIAGNOSIS — F17210 Nicotine dependence, cigarettes, uncomplicated: Secondary | ICD-10-CM | POA: Insufficient documentation

## 2019-03-14 DIAGNOSIS — F1210 Cannabis abuse, uncomplicated: Secondary | ICD-10-CM | POA: Insufficient documentation

## 2019-03-14 DIAGNOSIS — F1610 Hallucinogen abuse, uncomplicated: Secondary | ICD-10-CM | POA: Insufficient documentation

## 2019-03-14 DIAGNOSIS — H109 Unspecified conjunctivitis: Secondary | ICD-10-CM

## 2019-03-14 DIAGNOSIS — H1032 Unspecified acute conjunctivitis, left eye: Secondary | ICD-10-CM | POA: Insufficient documentation

## 2019-03-14 MED ORDER — BACITRACIN-POLYMYXIN B 500-10000 UNIT/GM OP OINT: TOPICAL_OINTMENT | OPHTHALMIC | 0 refills | Status: DC

## 2019-03-14 NOTE — Discharge Instructions (Signed)
Please use antibiotic eye drops as prescribed to treat your pink eye Wash all sheets, bedding, towels to prevent further spread Follow up with your PCP. If you do not have one you can follow up with [facility] for your primary care needs.

## 2019-03-14 NOTE — ED Notes (Signed)
Pt states his GF had pink eye and he caught it from her
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
## 2019-03-14 NOTE — ED Triage Notes (Signed)
Pt complains of left eye redness and irritation that started yesterday. Believes he has pink eye.

## 2019-04-09 ENCOUNTER — Encounter (HOSPITAL_COMMUNITY): Payer: Self-pay | Admitting: Emergency Medicine

## 2019-04-09 ENCOUNTER — Emergency Department (HOSPITAL_COMMUNITY)
Admission: EM | Admit: 2019-04-09 | Discharge: 2019-04-09 | Disposition: A | Discharge status: Home / Self Care | Payer: Self-pay | Attending: Emergency Medicine | Admitting: Emergency Medicine

## 2019-04-09 ENCOUNTER — Other Ambulatory Visit: Payer: Self-pay

## 2019-04-09 DIAGNOSIS — Z9081 Acquired absence of spleen: Secondary | ICD-10-CM | POA: Insufficient documentation

## 2019-04-09 DIAGNOSIS — Z202 Contact with and (suspected) exposure to infections with a predominantly sexual mode of transmission: Secondary | ICD-10-CM | POA: Insufficient documentation

## 2019-04-09 DIAGNOSIS — F17210 Nicotine dependence, cigarettes, uncomplicated: Secondary | ICD-10-CM | POA: Insufficient documentation

## 2019-04-09 LAB — URINALYSIS, ROUTINE W REFLEX MICROSCOPIC
Bilirubin Urine: NEGATIVE
Glucose, UA: NEGATIVE mg/dL
Hgb urine dipstick: NEGATIVE
Ketones, ur: NEGATIVE mg/dL
Leukocytes,Ua: NEGATIVE
Nitrite: NEGATIVE
Protein, ur: NEGATIVE mg/dL
Specific Gravity, Urine: 1.026 (ref 1.005–1.030)
pH: 5 (ref 5.0–8.0)

## 2019-04-09 MED ORDER — ONDANSETRON 4 MG PO TBDP
4.0000 mg | ORAL_TABLET | Freq: Once | ORAL | Status: AC
  Administered 2019-04-09: 15:00:00 4 mg via ORAL
  Filled 2019-04-09: qty 1

## 2019-04-09 MED ORDER — CEFTRIAXONE SODIUM 1 G IJ SOLR
500.0000 mg | Freq: Once | INTRAMUSCULAR | Status: AC
  Administered 2019-04-09: 15:00:00 500 mg via INTRAMUSCULAR

## 2019-04-09 MED ORDER — STERILE WATER FOR INJECTION IJ SOLN
INTRAMUSCULAR | Status: AC
  Filled 2019-04-09: qty 10

## 2019-04-09 MED ORDER — AZITHROMYCIN 250 MG PO TABS
1000.0000 mg | ORAL_TABLET | Freq: Once | ORAL | Status: AC
  Administered 2019-04-09: 1000 mg via ORAL
  Filled 2019-04-09: qty 4

## 2019-04-09 MED ORDER — METRONIDAZOLE 500 MG PO TABS
2000.0000 mg | ORAL_TABLET | Freq: Once | ORAL | Status: AC
  Administered 2019-04-09: 2000 mg via ORAL
  Filled 2019-04-09: qty 4

## 2019-04-09 NOTE — ED Triage Notes (Signed)
States sexual partner tested + for STD.  Pt denies any symptoms.

## 2019-04-09 NOTE — Discharge Instructions (Signed)
You were tested and treated today for trich, gonorrhea and chlamydia. You will be called in the next 2 to 3 days if any results are positive, you can also review negative results online through your mychart account.  Please make sure you are using protection when sexually active to help prevent further STDs.  You can go to the health department for any future STD testing needs.
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
## 2019-04-12 LAB — GC/CHLAMYDIA PROBE AMP (~~LOC~~) NOT AT ARMC
Chlamydia: NEGATIVE
Neisseria Gonorrhea: NEGATIVE

## 2019-08-03 ENCOUNTER — Encounter (HOSPITAL_COMMUNITY): Payer: Self-pay

## 2019-08-03 ENCOUNTER — Other Ambulatory Visit: Payer: Self-pay

## 2019-08-03 ENCOUNTER — Emergency Department (HOSPITAL_COMMUNITY)
Admission: EM | Admit: 2019-08-03 | Discharge: 2019-08-03 | Disposition: A | Discharge status: Home / Self Care | Payer: Self-pay | Attending: Emergency Medicine | Admitting: Emergency Medicine

## 2019-08-03 ENCOUNTER — Emergency Department (HOSPITAL_COMMUNITY): Payer: Self-pay

## 2019-08-03 DIAGNOSIS — Y939 Activity, unspecified: Secondary | ICD-10-CM | POA: Insufficient documentation

## 2019-08-03 DIAGNOSIS — Y929 Unspecified place or not applicable: Secondary | ICD-10-CM | POA: Insufficient documentation

## 2019-08-03 DIAGNOSIS — W208XXA Other cause of strike by thrown, projected or falling object, initial encounter: Secondary | ICD-10-CM | POA: Insufficient documentation

## 2019-08-03 DIAGNOSIS — F17210 Nicotine dependence, cigarettes, uncomplicated: Secondary | ICD-10-CM | POA: Insufficient documentation

## 2019-08-03 DIAGNOSIS — S9032XA Contusion of left foot, initial encounter: Secondary | ICD-10-CM | POA: Insufficient documentation

## 2019-08-03 DIAGNOSIS — F1210 Cannabis abuse, uncomplicated: Secondary | ICD-10-CM | POA: Insufficient documentation

## 2019-08-03 DIAGNOSIS — Y999 Unspecified external cause status: Secondary | ICD-10-CM | POA: Insufficient documentation

## 2019-08-03 NOTE — Progress Notes (Signed)
Orthopedic Tech Progress Note Patient Details:  VAN SEYMORE [birth year] [id number] Patient Refused Crutches. Patient ID: MIKHAI BIENVENUE, male   DOB: 1996-02-18, 24 y.o.   MRN: [id number]   Zebedee Iba 08/03/2019, 5:13 PM
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
## 2019-08-03 NOTE — ED Triage Notes (Signed)
Pt presents with c/o left foot pain. Pt reports he had a dryer fall on his foot two days ago. Pt is ambulatory to triage.

## 2019-08-03 NOTE — Discharge Instructions (Signed)
Your xrays did not show any breaks in your bone today. Your symptoms are likely related to a contusion caused by the dryer falling onto your foot. Please use crutches as needed given worsening pain with bearing weight onto your foot.  While at home please rest, ice, and elevate your foot to reduce inflammation/swelling You may take Ibuprofen and Tylenol as needed for pain.  Follow up with [facility] if no improvement in your symptoms in 1 week Return to the ED for any worsening symptoms including worsening pain, worsening swelling, redness to your foot, fevers > 100.4

## 2020-07-06 ENCOUNTER — Emergency Department (HOSPITAL_COMMUNITY)
Admission: EM | Admit: 2020-07-06 | Discharge: 2020-07-06 | Disposition: A | Discharge status: Home / Self Care | Payer: Self-pay | Attending: Emergency Medicine | Admitting: Emergency Medicine

## 2020-07-06 ENCOUNTER — Encounter (HOSPITAL_COMMUNITY): Payer: Self-pay

## 2020-07-06 DIAGNOSIS — Z5321 Procedure and treatment not carried out due to patient leaving prior to being seen by health care provider: Secondary | ICD-10-CM | POA: Insufficient documentation

## 2020-07-06 DIAGNOSIS — F17210 Nicotine dependence, cigarettes, uncomplicated: Secondary | ICD-10-CM | POA: Insufficient documentation

## 2020-07-06 DIAGNOSIS — Z20822 Contact with and (suspected) exposure to covid-19: Secondary | ICD-10-CM | POA: Insufficient documentation

## 2020-07-06 DIAGNOSIS — R451 Restlessness and agitation: Secondary | ICD-10-CM | POA: Insufficient documentation

## 2020-07-06 DIAGNOSIS — F22 Delusional disorders: Secondary | ICD-10-CM | POA: Insufficient documentation

## 2020-07-06 LAB — CBC WITH DIFFERENTIAL/PLATELET
Abs Immature Granulocytes: 0.04 10*3/uL (ref 0.00–0.07)
Basophils Absolute: 0 10*3/uL (ref 0.0–0.1)
Basophils Relative: 0 %
Eosinophils Absolute: 0 10*3/uL (ref 0.0–0.5)
Eosinophils Relative: 0 %
HCT: 47.6 % (ref 39.0–52.0)
Hemoglobin: 16.7 g/dL (ref 13.0–17.0)
Immature Granulocytes: 0 %
Lymphocytes Relative: 11 %
Lymphs Abs: 1.2 10*3/uL (ref 0.7–4.0)
MCH: 32.1 pg (ref 26.0–34.0)
MCHC: 35.1 g/dL (ref 30.0–36.0)
MCV: 91.4 fL (ref 80.0–100.0)
Monocytes Absolute: 0.8 10*3/uL (ref 0.1–1.0)
Monocytes Relative: 7 %
Neutro Abs: 9.3 10*3/uL — ABNORMAL HIGH (ref 1.7–7.7)
Neutrophils Relative %: 82 %
Platelets: 266 10*3/uL (ref 150–400)
RBC: 5.21 MIL/uL (ref 4.22–5.81)
RDW: 13.3 % (ref 11.5–15.5)
WBC: 11.4 10*3/uL — ABNORMAL HIGH (ref 4.0–10.5)
nRBC: 0 % (ref 0.0–0.2)

## 2020-07-06 LAB — COMPREHENSIVE METABOLIC PANEL
ALT: 21 U/L (ref 0–44)
AST: 24 U/L (ref 15–41)
Albumin: 4.4 g/dL (ref 3.5–5.0)
Alkaline Phosphatase: 49 U/L (ref 38–126)
Anion gap: 13 (ref 5–15)
BUN: 9 mg/dL (ref 6–20)
CO2: 21 mmol/L — ABNORMAL LOW (ref 22–32)
Calcium: 9.5 mg/dL (ref 8.9–10.3)
Chloride: 104 mmol/L (ref 98–111)
Creatinine, Ser: 1.5 mg/dL — ABNORMAL HIGH (ref 0.61–1.24)
GFR, Estimated: >60 mL/min (ref >60)
Glucose, Bld: 117 mg/dL — ABNORMAL HIGH (ref 70–99)
Potassium: 3.6 mmol/L (ref 3.5–5.1)
Sodium: 138 mmol/L (ref 135–145)
Total Bilirubin: 1.6 mg/dL — ABNORMAL HIGH (ref 0.3–1.2)
Total Protein: 7.5 g/dL (ref 6.5–8.1)

## 2020-07-06 LAB — ETHANOL: Alcohol, Ethyl (B): <10 mg/dL (ref <10)

## 2020-07-06 LAB — RESP PANEL BY RT-PCR (FLU A&B, COVID) ARPGX2
Influenza A by PCR: NEGATIVE
Influenza B by PCR: NEGATIVE
SARS Coronavirus 2 by RT PCR: NEGATIVE

## 2020-07-06 LAB — ACETAMINOPHEN LEVEL: Acetaminophen (Tylenol), Serum: <10 ug/mL — ABNORMAL LOW (ref 10–30)

## 2020-07-06 LAB — SALICYLATE LEVEL: Salicylate Lvl: <7 mg/dL — ABNORMAL LOW (ref 7.0–30.0)

## 2020-07-06 LAB — CK: Total CK: 304 U/L (ref 49–397)

## 2020-07-06 MED ORDER — ONDANSETRON HCL 4 MG/2ML IJ SOLN
4.0000 mg | Freq: Once | INTRAMUSCULAR | Status: AC
  Administered 2020-07-06: 4 mg via INTRAVENOUS
  Filled 2020-07-06: qty 2

## 2020-07-06 MED ORDER — LORAZEPAM 2 MG/ML IJ SOLN
2.0000 mg | Freq: Once | INTRAMUSCULAR | Status: AC
  Administered 2020-07-06: 2 mg via INTRAVENOUS
  Filled 2020-07-06: qty 1

## 2020-07-06 MED ORDER — SODIUM CHLORIDE 0.9 % IV BOLUS
1000.0000 mL | Freq: Once | INTRAVENOUS | Status: AC
  Administered 2020-07-06: 1000 mL via INTRAVENOUS

## 2020-07-06 NOTE — ED Notes (Addendum)
At this time , MD at bedside and writer & MD agreed patient can be released from restraints. [employer] at bedside. Pt provided with sandwich & drink at this time. Pt is alert and orient . Pt is on cardiac monitor. Skin is intact at the time restraints were removed

## 2020-07-06 NOTE — ED Notes (Addendum)
As triage staff was preparing to take pt to a room, pt became aggitated and refused to go back into triage room. Pt then said he did not want to be seen, just wanted to call a ride from the lobby. Pt was directed toward lobby with [employer], however refused to go through the doors and into the lobby.  Staff and [employer] advised the pt that he could not just stand around in the halls/doorways of the hospital and restated that we would care for pt if he wishes to be evaluated by a provider. Pt became more uncooperative and continued to refuse care, however he is still refusing to exit triage area as well. [employer] directed and accompanied pt to lobby and offered to call a ride for pt since his phone was dead.

## 2020-07-06 NOTE — ED Notes (Signed)
Pt refusing treatment, offered patient multiple times to go back to a room to see a provider to be evaluated but patient stated he wants to call a ride to leave. Offered patient a phone to use and to assist in getting a safe ride but patient refuses.

## 2020-07-06 NOTE — Discharge Instructions (Addendum)
This patient is medically cleared.  He is able to ambulate on his own.  He has normal vital signs.  He is able to answer questions and follow instructions.  There may still be residual intoxication however nothing that necessitates further emergent care.  If things change she has new concerns please seek care

## 2020-07-06 NOTE — ED Notes (Signed)
[employer] outside of room and pt is on cardiac monitor

## 2020-07-06 NOTE — ED Notes (Addendum)
Pt is on cardiac monitor. Pt has (3) [employer] officers at bedside at this time.
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
## 2020-07-06 NOTE — ED Notes (Signed)
Pt placed on cardiac monitor at the time of arrival.

## 2020-07-06 NOTE — ED Notes (Signed)
Pt was escorted to jail with [employer]
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
## 2020-07-06 NOTE — ED Notes (Signed)
Called in lunch tray for patient.
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
## 2020-07-06 NOTE — ED Notes (Signed)
Pt denying any suicidal or homicidal thoughts and able to provide contact information for safe ride home.

## 2020-07-11 ENCOUNTER — Other Ambulatory Visit: Payer: Self-pay

## 2020-07-11 ENCOUNTER — Encounter (HOSPITAL_COMMUNITY): Payer: Self-pay

## 2020-07-11 ENCOUNTER — Emergency Department (HOSPITAL_COMMUNITY)
Admission: EM | Admit: 2020-07-11 | Discharge: 2020-07-11 | Disposition: A | Discharge status: Home / Self Care | Attending: Emergency Medicine | Admitting: Emergency Medicine

## 2020-07-11 ENCOUNTER — Emergency Department (HOSPITAL_COMMUNITY)

## 2020-07-11 DIAGNOSIS — F17210 Nicotine dependence, cigarettes, uncomplicated: Secondary | ICD-10-CM | POA: Insufficient documentation

## 2020-07-11 DIAGNOSIS — N200 Calculus of kidney: Secondary | ICD-10-CM | POA: Diagnosis not present

## 2020-07-11 DIAGNOSIS — R109 Unspecified abdominal pain: Secondary | ICD-10-CM | POA: Diagnosis present

## 2020-07-11 LAB — URINALYSIS, ROUTINE W REFLEX MICROSCOPIC
Bacteria, UA: NONE SEEN
Bilirubin Urine: NEGATIVE
Glucose, UA: NEGATIVE mg/dL
Ketones, ur: 5 mg/dL — AB
Leukocytes,Ua: NEGATIVE
Nitrite: NEGATIVE
Protein, ur: 30 mg/dL — AB
RBC / HPF: >50 RBC/hpf — ABNORMAL HIGH (ref 0–5)
Specific Gravity, Urine: >1.046 — ABNORMAL HIGH (ref 1.005–1.030)
pH: 5 (ref 5.0–8.0)

## 2020-07-11 LAB — CBC WITH DIFFERENTIAL/PLATELET
Abs Immature Granulocytes: 0.06 10*3/uL (ref 0.00–0.07)
Basophils Absolute: 0.1 10*3/uL (ref 0.0–0.1)
Basophils Relative: 0 %
Eosinophils Absolute: 0.1 10*3/uL (ref 0.0–0.5)
Eosinophils Relative: 1 %
HCT: 48.7 % (ref 39.0–52.0)
Hemoglobin: 16.3 g/dL (ref 13.0–17.0)
Immature Granulocytes: 0 %
Lymphocytes Relative: 13 %
Lymphs Abs: 1.9 10*3/uL (ref 0.7–4.0)
MCH: 31.5 pg (ref 26.0–34.0)
MCHC: 33.5 g/dL (ref 30.0–36.0)
MCV: 94 fL (ref 80.0–100.0)
Monocytes Absolute: 1.2 10*3/uL — ABNORMAL HIGH (ref 0.1–1.0)
Monocytes Relative: 9 %
Neutro Abs: 11.3 10*3/uL — ABNORMAL HIGH (ref 1.7–7.7)
Neutrophils Relative %: 77 %
Platelets: 266 10*3/uL (ref 150–400)
RBC: 5.18 MIL/uL (ref 4.22–5.81)
RDW: 13.2 % (ref 11.5–15.5)
WBC: 14.6 10*3/uL — ABNORMAL HIGH (ref 4.0–10.5)
nRBC: 0 % (ref 0.0–0.2)

## 2020-07-11 LAB — COMPREHENSIVE METABOLIC PANEL
ALT: 19 U/L (ref 0–44)
AST: 25 U/L (ref 15–41)
Albumin: 4.4 g/dL (ref 3.5–5.0)
Alkaline Phosphatase: 45 U/L (ref 38–126)
Anion gap: 11 (ref 5–15)
BUN: 11 mg/dL (ref 6–20)
CO2: 26 mmol/L (ref 22–32)
Calcium: 9.5 mg/dL (ref 8.9–10.3)
Chloride: 104 mmol/L (ref 98–111)
Creatinine, Ser: 1.57 mg/dL — ABNORMAL HIGH (ref 0.61–1.24)
GFR, Estimated: >60 mL/min (ref >60)
Glucose, Bld: 109 mg/dL — ABNORMAL HIGH (ref 70–99)
Potassium: 4.6 mmol/L (ref 3.5–5.1)
Sodium: 141 mmol/L (ref 135–145)
Total Bilirubin: 1.2 mg/dL (ref 0.3–1.2)
Total Protein: 7.4 g/dL (ref 6.5–8.1)

## 2020-07-11 LAB — LIPASE, BLOOD: Lipase: 24 U/L (ref 11–51)

## 2020-07-11 MED ORDER — TAMSULOSIN HCL 0.4 MG PO CAPS: 0.4000 mg | ORAL_CAPSULE | Freq: Every day | ORAL | 0 refills | Status: DC

## 2020-07-11 MED ORDER — IOHEXOL 300 MG/ML  SOLN
100.0000 mL | Freq: Once | INTRAMUSCULAR | Status: AC | PRN
  Administered 2020-07-11: 100 mL via INTRAVENOUS

## 2020-07-11 MED ORDER — SODIUM CHLORIDE 0.9 % IV BOLUS
1000.0000 mL | Freq: Once | INTRAVENOUS | Status: AC
  Administered 2020-07-11: 1000 mL via INTRAVENOUS

## 2020-07-11 MED ORDER — SODIUM CHLORIDE 0.9 % IV BOLUS
500.0000 mL | Freq: Once | INTRAVENOUS | Status: AC
  Administered 2020-07-11: 500 mL via INTRAVENOUS

## 2020-07-11 MED ORDER — HYDROCODONE-ACETAMINOPHEN 5-325 MG PO TABS: 1.0000 | ORAL_TABLET | Freq: Four times a day (QID) | ORAL | 0 refills | Status: AC | PRN

## 2020-07-11 MED ORDER — HYDROMORPHONE HCL 1 MG/ML IJ SOLN
1.0000 mg | Freq: Once | INTRAMUSCULAR | Status: AC
  Administered 2020-07-11: 1 mg via INTRAVENOUS
  Filled 2020-07-11: qty 1

## 2020-07-11 MED ORDER — HYDROCODONE-ACETAMINOPHEN 7.5-325 MG PO TABS
1.0000 | ORAL_TABLET | Freq: Once | ORAL | Status: AC
  Administered 2020-07-11: 1 via ORAL
  Filled 2020-07-11: qty 1

## 2020-07-11 MED ORDER — ONDANSETRON HCL 4 MG/2ML IJ SOLN
4.0000 mg | Freq: Once | INTRAMUSCULAR | Status: AC
  Administered 2020-07-11: 4 mg via INTRAVENOUS
  Filled 2020-07-11: qty 2

## 2020-07-11 MED ORDER — MORPHINE SULFATE (PF) 4 MG/ML IV SOLN
4.0000 mg | Freq: Once | INTRAVENOUS | Status: AC
Start: 2020-07-11 — End: 2020-07-11
  Administered 2020-07-11: 4 mg via INTRAVENOUS
  Filled 2020-07-11: qty 1

## 2020-07-11 NOTE — ED Notes (Signed)
Patient again informed of need for urine specimen 

## 2020-07-11 NOTE — ED Notes (Signed)
Patient again informed of need for urine specimen

## 2020-07-11 NOTE — ED Triage Notes (Signed)
Coming from [facility], patient complaining of abdominal pain, blood in vomit, right sided abdominal pain, 8/10, constant

## 2020-07-11 NOTE — ED Notes (Signed)
Patient informed of need for urine specimen 

## 2020-07-11 NOTE — Discharge Instructions (Signed)
You have been seen and discharged from the emergency department.  You were diagnosed with a right-sided kidney stone.  This should pass on its own.  Treatment will be for pain control, Flomax to make the passing easier, hydration.  Take home medications as prescribed. If you have any worsening symptoms, high fevers, severe abdominal pain, inability to urinate or further concerns for health please return to an emergency department for further evaluation.
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
## 2021-12-30 ENCOUNTER — Other Ambulatory Visit: Payer: Self-pay

## 2021-12-30 ENCOUNTER — Other Ambulatory Visit (HOSPITAL_COMMUNITY)
Admission: EM | Admit: 2021-12-30 | Discharge: 2022-01-03 | Disposition: A | Discharge status: Home / Self Care | Payer: No Payment, Other | Attending: Psychiatry | Admitting: Psychiatry

## 2021-12-30 DIAGNOSIS — Z20822 Contact with and (suspected) exposure to covid-19: Secondary | ICD-10-CM | POA: Insufficient documentation

## 2021-12-30 DIAGNOSIS — F1120 Opioid dependence, uncomplicated: Secondary | ICD-10-CM | POA: Diagnosis present

## 2021-12-30 DIAGNOSIS — F17200 Nicotine dependence, unspecified, uncomplicated: Secondary | ICD-10-CM | POA: Diagnosis present

## 2021-12-30 DIAGNOSIS — F1994 Other psychoactive substance use, unspecified with psychoactive substance-induced mood disorder: Secondary | ICD-10-CM | POA: Diagnosis present

## 2021-12-30 DIAGNOSIS — F1020 Alcohol dependence, uncomplicated: Secondary | ICD-10-CM | POA: Diagnosis present

## 2021-12-30 DIAGNOSIS — R9431 Abnormal electrocardiogram [ECG] [EKG]: Secondary | ICD-10-CM | POA: Diagnosis present

## 2021-12-30 DIAGNOSIS — F32A Depression, unspecified: Secondary | ICD-10-CM | POA: Diagnosis not present

## 2021-12-30 DIAGNOSIS — F1290 Cannabis use, unspecified, uncomplicated: Secondary | ICD-10-CM | POA: Diagnosis present

## 2021-12-30 DIAGNOSIS — R011 Cardiac murmur, unspecified: Secondary | ICD-10-CM

## 2021-12-30 LAB — COMPREHENSIVE METABOLIC PANEL
ALT: 19 U/L (ref 0–44)
AST: 18 U/L (ref 15–41)
Albumin: 4.4 g/dL (ref 3.5–5.0)
Alkaline Phosphatase: 56 U/L (ref 38–126)
Anion gap: 10 (ref 5–15)
BUN: 7 mg/dL (ref 6–20)
CO2: 27 mmol/L (ref 22–32)
Calcium: 9.6 mg/dL (ref 8.9–10.3)
Chloride: 101 mmol/L (ref 98–111)
Creatinine, Ser: 1.06 mg/dL (ref 0.61–1.24)
GFR, Estimated: >60 mL/min (ref >60)
Glucose, Bld: 63 mg/dL — ABNORMAL LOW (ref 70–99)
Potassium: 4.5 mmol/L (ref 3.5–5.1)
Sodium: 138 mmol/L (ref 135–145)
Total Bilirubin: 0.9 mg/dL (ref 0.3–1.2)
Total Protein: 7.3 g/dL (ref 6.5–8.1)

## 2021-12-30 LAB — TSH: TSH: 0.383 u[IU]/mL (ref 0.350–4.500)

## 2021-12-30 LAB — CBC WITH DIFFERENTIAL/PLATELET
Abs Immature Granulocytes: 0.02 10*3/uL (ref 0.00–0.07)
Basophils Absolute: 0 10*3/uL (ref 0.0–0.1)
Basophils Relative: 1 %
Eosinophils Absolute: 0.4 10*3/uL (ref 0.0–0.5)
Eosinophils Relative: 6 %
HCT: 47.7 % (ref 39.0–52.0)
Hemoglobin: 16.1 g/dL (ref 13.0–17.0)
Immature Granulocytes: 0 %
Lymphocytes Relative: 27 %
Lymphs Abs: 1.9 10*3/uL (ref 0.7–4.0)
MCH: 31.8 pg (ref 26.0–34.0)
MCHC: 33.8 g/dL (ref 30.0–36.0)
MCV: 94.3 fL (ref 80.0–100.0)
Monocytes Absolute: 0.7 10*3/uL (ref 0.1–1.0)
Monocytes Relative: 10 %
Neutro Abs: 4 10*3/uL (ref 1.7–7.7)
Neutrophils Relative %: 56 %
Platelets: 320 10*3/uL (ref 150–400)
RBC: 5.06 MIL/uL (ref 4.22–5.81)
RDW: 13.5 % (ref 11.5–15.5)
WBC: 7.1 10*3/uL (ref 4.0–10.5)
nRBC: 0 % (ref 0.0–0.2)

## 2021-12-30 LAB — RESP PANEL BY RT-PCR (FLU A&B, COVID) ARPGX2
Influenza A by PCR: NEGATIVE
Influenza B by PCR: NEGATIVE
SARS Coronavirus 2 by RT PCR: NEGATIVE

## 2021-12-30 LAB — LIPID PANEL
Cholesterol: 122 mg/dL (ref 0–200)
HDL: 29 mg/dL — ABNORMAL LOW (ref >40)
LDL Cholesterol: 82 mg/dL (ref 0–99)
Total CHOL/HDL Ratio: 4.2 RATIO
Triglycerides: 56 mg/dL (ref <150)
VLDL: 11 mg/dL (ref 0–40)

## 2021-12-30 LAB — MAGNESIUM: Magnesium: 2.2 mg/dL (ref 1.7–2.4)

## 2021-12-30 LAB — HEMOGLOBIN A1C
Hgb A1c MFr Bld: 5.5 % (ref 4.8–5.6)
Mean Plasma Glucose: 111.15 mg/dL

## 2021-12-30 LAB — ETHANOL: Alcohol, Ethyl (B): <10 mg/dL (ref <10)

## 2021-12-30 MED ORDER — NAPROXEN 500 MG PO TABS: 500.0000 mg | ORAL_TABLET | Freq: Two times a day (BID) | ORAL | Status: DC | PRN

## 2021-12-30 MED ORDER — DICYCLOMINE HCL 20 MG PO TABS: 20.0000 mg | ORAL_TABLET | Freq: Four times a day (QID) | ORAL | Status: DC | PRN

## 2021-12-30 MED ORDER — HYDROXYZINE HCL 25 MG PO TABS: 25.0000 mg | ORAL_TABLET | Freq: Four times a day (QID) | ORAL | Status: DC | PRN

## 2021-12-30 MED ORDER — OLANZAPINE 5 MG PO TABS
ORAL_TABLET | ORAL | Status: AC
  Filled 2021-12-30: qty 1

## 2021-12-30 MED ORDER — LOPERAMIDE HCL 2 MG PO CAPS: 2.0000 mg | ORAL_CAPSULE | ORAL | Status: DC | PRN

## 2021-12-30 MED ORDER — METHOCARBAMOL 500 MG PO TABS: 500.0000 mg | ORAL_TABLET | Freq: Three times a day (TID) | ORAL | Status: DC | PRN

## 2021-12-30 MED ORDER — ALUM & MAG HYDROXIDE-SIMETH 200-200-20 MG/5ML PO SUSP
30.0000 mL | ORAL | Status: DC | PRN
Start: 2021-12-30 — End: 2022-01-03

## 2021-12-30 MED ORDER — ONDANSETRON 4 MG PO TBDP: 4.0000 mg | ORAL_TABLET | Freq: Four times a day (QID) | ORAL | Status: DC | PRN

## 2021-12-30 MED ORDER — OLANZAPINE 5 MG PO TBDP
5.0000 mg | ORAL_TABLET | Freq: Every day | ORAL | Status: DC
  Administered 2021-12-30 – 2021-12-31 (×2): 5 mg via ORAL
  Filled 2021-12-30: qty 1

## 2021-12-30 MED ORDER — MAGNESIUM HYDROXIDE 400 MG/5ML PO SUSP: 30.0000 mL | Freq: Every day | ORAL | Status: DC | PRN

## 2021-12-30 MED ORDER — ACETAMINOPHEN 325 MG PO TABS: 650.0000 mg | ORAL_TABLET | Freq: Four times a day (QID) | ORAL | Status: DC | PRN

## 2021-12-30 NOTE — BH Assessment (Signed)
Comprehensive Clinical Assessment (CCA) Note  12/30/2021 Colton Blair [id number]  Disposition: Per Thomes Lolling, NP admission to continuous assessment at [facility] is recommended for further monitoring/evaluation with AM reassessment by psychiatry to determine the most appropriate disposition plan.   The patient demonstrates the following risk factors for suicide: Chronic risk factors for suicide include: substance use disorder. Acute risk factors for suicide include: family or marital conflict and social withdrawal/isolation. Protective factors for this patient include: positive social support, coping skills, hope for the future, and life satisfaction. Considering these factors, the overall suicide risk at this point appears to be moderate. Patient is appropriate for outpatient follow up once stabilized.   Patient is a 26 year old male with a history of depression and substance abuse who presents voluntarily to [facility] for assessment.  Patient presents voluntarily requesting admission for "my depression, stress and to get my mind right."  He reports he has been on the streets for a couple of days since he and his mother got into it.  He states his aunt told him to come here to get some help.  Patient endorses passive SI, no plan or intent.  He denies HI.  He does appear to be vague with reports.  He endorses AH, however states  he can make out what they say "a little bit" and is unable to describe the voices with any detail.  He then states he is using "drugs" stating he uses "pills" although he is unable to provide further details.  He then asks if he could stay "a couple of days for my depression and to detox."    Provider met with patient and he then began to offer additional symptoms, and was also peering around the room as if responding to internal stimuli.  He also reported he is using percocet and ecstasy, although he continues to be vague in reference to use hx/patterns.     Chief Complaint: "depression, stress"  Visit Diagnosis: Substance Induced Mood Disorder  Flowsheet Row ED from 12/30/2021 in [facility]  Thoughts that you would be better off dead, or of hurting yourself in some way Several days  PHQ-9 Total Score 7      [place] ED from 12/30/2021 in [facility] ED from 07/11/2020 in [place] DEPT Admission (Discharged) from OP Visit from 10/07/2018 in [place] Error: Q7 should not be populated when Q6 is No No Risk No Risk     Risk = Moderate today, score isn't populating correctly   CCA Screening, Triage and Referral (STR)  Patient Reported Information How did you hear about [place]? Self  What Is the Reason for Your Visit/Call Today? Patient presents voluntarily requesting admission for "my depression, stress and to get my mind right."  He reports he has been on the streets for a couple of days since he and his mother got into it.  He states his aunt told him to come here to get some help.  Patient endorses passive SI, no plan or intent.  He denies HI.  He does appear to be vague with reports.  He endorses AH, however states "a little bit" and is unable to describe the voices.  He then states he is using "drugs" stating he uses "pills" although he is unable to provide further details.  He then asks if he could stay "a couple of days for my depression and to  detox."  How Long Has This Been Causing You Problems? 1-6 months  What Do You Feel Would Help You the Most Today? Alcohol or Drug Use Treatment; Treatment for Depression or other mood problem   Have You Recently Had Any Thoughts About Hurting Yourself? Yes  Are You Planning to Commit Suicide/Harm Yourself At This time? No   Have you Recently Had Thoughts About [place]? No  Are You Planning to Harm Someone at This Time? No  Explanation: No  data recorded  Have You Used Any Alcohol or Drugs in the Past 24 Hours? Yes  How Long Ago Did You Use Drugs or Alcohol? No data recorded What Did You Use and How Much? Pt unable to provide details about pills he uses or use patterns   Do You Currently Have a Therapist/Psychiatrist? No data recorded Name of Therapist/Psychiatrist: No data recorded  Have You Been Recently Discharged From Any Office Practice or Programs? No data recorded Explanation of Discharge From Practice/Program: No data recorded    CCA Screening Triage Referral Assessment Type of Contact: No data recorded Telemedicine Service Delivery:   Is this Initial or Reassessment? No data recorded Date Telepsych consult ordered in [facility]:  No data recorded Time Telepsych consult ordered in [facility]:  No data recorded Location of Assessment: No data recorded Provider Location: No data recorded  Collateral Involvement: No data recorded  Does Patient Have a [place]? No data recorded Legal Guardian Contact Information: No data recorded Copy of Legal Guardianship Form: No data recorded Legal Guardian Notified of Arrival: No data recorded Legal Guardian Notified of Pending Discharge: No data recorded If Minor and Not Living with Parent(s), Who has Custody? No data recorded Is CPS involved or ever been involved? No data recorded Is APS involved or ever been involved? No data recorded  Patient Determined To Be At Risk for Harm To Self or Others Based on Review of Patient Reported Information or Presenting Complaint? No data recorded Method: No data recorded Availability of Means: No data recorded Intent: No data recorded Notification Required: No data recorded Additional Information for Danger to Others Potential: No data recorded Additional Comments for Danger to Others Potential: No data recorded Are There Guns or Other Weapons in Your Home? No data recorded Types of Guns/Weapons: No data recorded Are  These Weapons Safely Secured?                            No data recorded Who Could Verify You Are Able To Have These Secured: No data recorded Do You Have any Outstanding Charges, Pending Court Dates, Parole/Probation? No data recorded Contacted To Inform of Risk of Harm To Self or Others: No data recorded   Does Patient Present under Involuntary Commitment? No data recorded IVC Papers Initial File Date: No data recorded  [place] of Residence: No data recorded  Patient Currently Receiving the Following Services: No data recorded  Determination of Need: Urgent (48 hours)   Options For Referral: [facility]     CCA Biopsychosocial Patient Reported Schizophrenia/Schizoaffective Diagnosis in Past: No   Strengths: Seeking treatment   Mental Health Symptoms Depression:   Difficulty Concentrating; Hopelessness; Worthlessness   Duration of Depressive symptoms:  Duration of Depressive Symptoms: Greater than two weeks   Mania:   None   Anxiety:    Worrying   Psychosis:   Hallucinations   Duration of Psychotic symptoms:  Duration of Psychotic Symptoms:  Less than six months   Trauma:   None   Obsessions:   None   Compulsions:   None   Inattention:   N/A   Hyperactivity/Impulsivity:   N/A   Oppositional/Defiant Behaviors:   N/A   Emotional Irregularity:   Chronic feelings of emptiness   Other Mood/Personality Symptoms:  No data recorded   Mental Status Exam Appearance and self-care  Stature:   Average   Weight:   Average weight   Clothing:   Disheveled; Dirty   Grooming:   Neglected   Cosmetic use:   None   Posture/gait:   Normal   Motor activity:   Not Remarkable   Sensorium  Attention:   Normal   Concentration:   Normal   Orientation:   X5   Recall/memory:   Normal   Affect and Mood  Affect:   Blunted   Mood:   Depressed   Relating  Eye contact:   Normal   Facial expression:   Responsive   Attitude toward  examiner:   Cooperative   Thought and Language  Speech flow:  Clear and Coherent   Thought content:   Appropriate to Mood and Circumstances   Preoccupation:   None   Hallucinations:   Auditory (does not provide details re:AH - vague)   Organization:  No data recorded  [employer] of Knowledge:   Average   Intelligence:   Average   Abstraction:   Functional   Judgement:   Fair   Reality Testing:   Adequate   Insight:   Fair   Decision Making:   Normal   Social Functioning  Social Maturity:   Responsible   Social Judgement:   Normal   Stress  Stressors:   Family conflict   Coping Ability:   Advice worker Deficits:   Interpersonal; Communication   Supports:   Family     Religion: Religion/Spirituality Are You A Religious Person?: No  Leisure/Recreation: Leisure / Recreation Do You Have Hobbies?: No  Exercise/Diet: Exercise/Diet Do You Exercise?: No Have You Gained or Lost A Significant Amount of Weight in the Past Six Months?: No Do You Follow a Special Diet?: No Do You Have Any Trouble Sleeping?: No   CCA Employment/Education Employment/Work Situation: Employment / Work Situation Employment Situation: Unemployed Has Patient ever Been in Passenger transport manager?: No  Education: Education Is Patient Currently Attending School?: No Last Grade Completed: 12 Did You Nutritional therapist?: No Did You Have An Individualized Education Program (IIEP): No Did You Have Any Difficulty At [employer]?: No Patient's Education Has Been Impacted by Current Illness: No   CCA Family/Childhood History Family and Relationship History: Family history Marital status: Single Does patient have children?: No  Childhood History:  Childhood History By whom was/is the patient raised?: Mother Did patient suffer any verbal/emotional/physical/sexual abuse as a child?: No Did patient suffer from severe childhood neglect?: No Has patient ever been  sexually abused/assaulted/raped as an adolescent or adult?: No Was the patient ever a victim of a crime or a disaster?: No Witnessed domestic violence?: No Has patient been affected by domestic violence as an adult?: No  Child/Adolescent Assessment:     CCA Substance Use Alcohol/Drug Use: Alcohol / Drug Use Pain Medications: None Prescriptions: None History of alcohol / drug use?: Yes Negative Consequences of Use: Personal relationships, Financial Withdrawal Symptoms: None Substance #1 Name of Substance 1: "Pills" 1 - Age of First Use: unknown 1 - Amount (size/oz): does not specify -  did not specify "pills" until later he stated he uses percocet 1 - Frequency: Unclear 1 - Duration: Did not provide use patterns 1 - Last Use / Amount: yesterday - unknown type or amount of "pills" 1 - Method of Aquiring: NA 1- Route of Use: NA Substance #2 Name of Substance 2: Ecstasy 2 - Age of First Use: Unknown 2 - Amount (size/oz): Pt unable to provide details or use patterns/hx 2 - Frequency: Pt unable to provide details or use patterns/hx 2 - Duration: Pt unable to provide details or use patterns/hx 2 - Last Use / Amount: Pt unable to provide details or use patterns/hx 2 - Method of Aquiring: Pt unable to provide details or use patterns/hx 2 - Route of Substance Use: Pt unable to provide details or use patterns/hx                     ASAM's:  Six Dimensions of Multidimensional Assessment  Dimension 1:  Acute Intoxication and/or Withdrawal Potential:   Dimension 1:  Description of individual's past and current experiences of substance use and withdrawal: Pt unable to provide details or use patterns/hx  Dimension 2:  Biomedical Conditions and Complications:      Dimension 3:  Emotional, Behavioral, or Cognitive Conditions and Complications:     Dimension 4:  Readiness to Change:     Dimension 5:  Relapse, Continued use, or Continued Problem Potential:     Dimension 6:   Recovery/Living Environment:     ASAM Severity Score:    ASAM Recommended Level of Treatment:     Substance use Disorder (SUD)    Recommendations for Services/Supports/Treatments:    Discharge Disposition:    DSM5 Diagnoses: Patient Active Problem List   Diagnosis Date Noted   Substance induced mood disorder ([place]) 10/07/2018   PTSD (post-traumatic stress disorder) 10/07/2018   Alcohol use disorder, severe, dependence ([place]) 10/07/2018   Heart murmur 04/18/2016   Anxiety and depression 04/18/2016   Does not have health insurance 04/18/2016   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

## 2021-12-30 NOTE — Progress Notes (Signed)
   12/30/21 1625  [place] Triage Screening (Walk-ins at [facility] only)  How Did You Hear About [place]? Self  What Is the Reason for Your Visit/Call Today? Patient presents voluntarily requesting admission for "my depression, stress and to get my mind right."  He reports he has been on the streets for a couple of days since he and his mother got into it.  He states his aunt told him to come here to get some help.  Patient endorses passive SI, no plan or intent.  He denies HI.  He does appear to be vague with reports.  He endorses AH, however states "a little bit" and is unable to describe the voices.  He then states he is using "drugs" stating he uses "pills" although he is unable to provide further details.  He then asks if he could stay "a couple of days for my depression and to detox."  How Long Has This Been Causing You Problems? 1-6 months  Have You Recently Had Any Thoughts About Hurting Yourself? Yes  How long ago did you have thoughts about hurting yourself? passive SI, no plan  Are You Planning to Commit Suicide/Harm Yourself At This time? No  Have you Recently Had Thoughts About [place]? No  Are You Planning To Harm Someone At This Time? No  Are you currently experiencing any auditory, visual or other hallucinations? Yes  Please explain the hallucinations you are currently experiencing: vague, does not appear to be responding to internal stimuli  Have You Used Any Alcohol or Drugs in the Past 24 Hours? Yes  How long ago did you use Drugs or Alcohol? unclear, patient is vague about using "pills"  What Did You Use and How Much? Pt unable to provide details about pills he uses or use patterns  Do you have any current medical co-morbidities that require immediate attention? No  Clinician description of patient physical appearance/behavior: Patient is disheveled and malodorous.  He is calm, cooperative AAOx5  What Do You Feel Would Help You the Most Today? Alcohol or Drug Use  Treatment;Treatment for Depression or other mood problem  If access to [facility] was not available, would you have sought care in the Emergency Department? No  Determination of Need Urgent (48 hours)  Options For Referral [facility]

## 2021-12-30 NOTE — ED Notes (Signed)
Received pt sleeping at present, no distress noted.  Monitoring for safety.
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
## 2021-12-31 ENCOUNTER — Encounter (HOSPITAL_COMMUNITY): Payer: Self-pay | Admitting: Emergency Medicine

## 2021-12-31 ENCOUNTER — Encounter (HOSPITAL_COMMUNITY): Payer: Self-pay

## 2021-12-31 DIAGNOSIS — Z20822 Contact with and (suspected) exposure to covid-19: Secondary | ICD-10-CM | POA: Diagnosis not present

## 2021-12-31 DIAGNOSIS — F1994 Other psychoactive substance use, unspecified with psychoactive substance-induced mood disorder: Secondary | ICD-10-CM | POA: Diagnosis not present

## 2021-12-31 DIAGNOSIS — F32A Depression, unspecified: Secondary | ICD-10-CM | POA: Diagnosis not present

## 2021-12-31 LAB — POCT URINE DRUG SCREEN - MANUAL ENTRY (I-SCREEN)
POC Amphetamine UR: POSITIVE — AB
POC Buprenorphine (BUP): NOT DETECTED
POC Cocaine UR: POSITIVE — AB
POC Marijuana UR: POSITIVE — AB
POC Methadone UR: NOT DETECTED
POC Methamphetamine UR: POSITIVE — AB
POC Morphine: NOT DETECTED
POC Oxazepam (BZO): POSITIVE — AB
POC Oxycodone UR: NOT DETECTED
POC Secobarbital (BAR): NOT DETECTED

## 2021-12-31 LAB — URINALYSIS, ROUTINE W REFLEX MICROSCOPIC
Bilirubin Urine: NEGATIVE
Glucose, UA: NEGATIVE mg/dL
Hgb urine dipstick: NEGATIVE
Ketones, ur: NEGATIVE mg/dL
Nitrite: NEGATIVE
Protein, ur: NEGATIVE mg/dL
Specific Gravity, Urine: 1.02 (ref 1.005–1.030)
pH: 5 (ref 5.0–8.0)

## 2021-12-31 LAB — RPR: RPR Ser Ql: NONREACTIVE

## 2021-12-31 LAB — POC SARS CORONAVIRUS 2 AG: SARSCOV2ONAVIRUS 2 AG: NEGATIVE

## 2021-12-31 MED ORDER — ADULT MULTIVITAMIN W/MINERALS CH
1.0000 | ORAL_TABLET | Freq: Every day | ORAL | Status: DC
  Administered 2021-12-31 – 2022-01-02 (×3): 1 via ORAL
  Filled 2021-12-31 (×4): qty 1

## 2021-12-31 MED ORDER — THIAMINE HCL 100 MG/ML IJ SOLN
100.0000 mg | Freq: Once | INTRAMUSCULAR | Status: DC
  Filled 2021-12-31: qty 2

## 2021-12-31 MED ORDER — CHLORDIAZEPOXIDE HCL 25 MG PO CAPS: 25.0000 mg | ORAL_CAPSULE | Freq: Four times a day (QID) | ORAL | Status: AC | PRN

## 2021-12-31 NOTE — Progress Notes (Signed)
Patient showered after coming onto unit and then went to sleep where he remains without distress or complaint.  No withdrawal symptoms notes.  Will continue to monitor.

## 2021-12-31 NOTE — ED Notes (Signed)
Pt is in the bed sleeping. Respirations are even and unlabored. No acute distress noted. Will continue to monitor for safety. 

## 2021-12-31 NOTE — ED Notes (Signed)
Pt sleeping at present, no distress noted, monitoring for safety. 

## 2021-12-31 NOTE — ED Notes (Signed)
Pt received dinner after pt got up from sleeping.

## 2021-12-31 NOTE — ED Notes (Signed)
Pt escorted to [facility] by Olivia Mackie MHT.   Gait steady and no complaints at this time.

## 2021-12-31 NOTE — BH IP Treatment Plan (Signed)
Interdisciplinary Treatment and Diagnostic Plan Update  12/31/2021 Time of Session: 0840 Colton Blair MRN: [id number]  Diagnosis:  Final diagnoses:  Substance induced mood disorder ([place])     Current Medications:  Current Facility-Administered Medications  Medication Dose Route Frequency Provider Last Rate Last Admin   acetaminophen (TYLENOL) tablet 650 mg  650 mg Oral Q6H PRN Revonda Humphrey, NP       alum & mag hydroxide-simeth (MAALOX/MYLANTA) 200-200-20 MG/5ML suspension 30 mL  30 mL Oral Q4H PRN Revonda Humphrey, NP       chlordiazePOXIDE (LIBRIUM) capsule 25 mg  25 mg Oral Q6H PRN White, Patrice L, NP       dicyclomine (BENTYL) tablet 20 mg  20 mg Oral Q6H PRN Revonda Humphrey, NP       hydrOXYzine (ATARAX) tablet 25 mg  25 mg Oral Q6H PRN Revonda Humphrey, NP       loperamide (IMODIUM) capsule 2-4 mg  2-4 mg Oral PRN Revonda Humphrey, NP       magnesium hydroxide (MILK OF MAGNESIA) suspension 30 mL  30 mL Oral Daily PRN Revonda Humphrey, NP       methocarbamol (ROBAXIN) tablet 500 mg  500 mg Oral Q8H PRN Revonda Humphrey, NP       multivitamin with minerals tablet 1 tablet  1 tablet Oral Daily White, Patrice L, NP   1 tablet at 12/31/21 1131   naproxen (NAPROSYN) tablet 500 mg  500 mg Oral BID PRN Revonda Humphrey, NP       OLANZapine zydis (ZYPREXA) disintegrating tablet 5 mg  5 mg Oral QHS Revonda Humphrey, NP   5 mg at 12/30/21 2122   ondansetron (ZOFRAN-ODT) disintegrating tablet 4 mg  4 mg Oral Q6H PRN Revonda Humphrey, NP       thiamine (VITAMIN B1) injection 100 mg  100 mg Intramuscular Once White, Patrice L, NP       No current outpatient medications on file.   PTA Medications: Prior to Admission medications   Not on File    Patient Stressors: Financial difficulties   Loss of possible housing   Marital or family conflict   Occupational concerns   Substance abuse   Other: May or may not be able to return home.    Patient Strengths:  Motivation for treatment/growth   Treatment Modalities: Medication Management, Group therapy, Case management,  1 to 1 session with clinician, Psychoeducation, Recreational therapy.   Physician Treatment Plan for Primary and Secondary Diagnosis:  Final diagnoses:  Substance induced mood disorder ([place])   Long Term Goal(s): Improvement in symptoms so as ready for discharge  Short Term Goals: Patient will attend at least of 50% of the groups daily. Pt will complete the PHQ9 on admission, day 3 and discharge. Patient will take medications as prescribed daily.  Medication Management: Evaluate patient's response, side effects, and tolerance of medication regimen.  Therapeutic Interventions: 1 to 1 sessions, Unit Group sessions and Medication administration.  Evaluation of Outcomes: Not Met  LCSW Treatment Plan for Primary Diagnosis:  Final diagnoses:  Substance induced mood disorder ([place])    Long Term Goal(s): Safe transition to appropriate next level of care at discharge.  Short Term Goals: Facilitate acceptance of mental health diagnosis and concerns through verbal commitment to aftercare plan and appointments at discharge., Patient will identify one social support prior to discharge to aid in patient's recovery., Patient will attend AA/NA groups as scheduled., Identify minimum of  2 triggers associated with mental health/substance abuse issues with treatment team members., and Increase skills for wellness and recovery by attending 50% of scheduled groups.  Therapeutic Interventions: Assess for all discharge needs, 1 to 1 time with Education officer, museum, Explore available resources and support systems, Assess for adequacy in community support network, Educate family and significant other(s) on suicide prevention, Complete Psychosocial Assessment, Interpersonal group therapy.  Evaluation of Outcomes: Not Met   Progress in Treatment: Attending groups: As evidenced by:  Just admitted to  [facility]. Participating in groups: As evidenced by:  Just admitted to [facility]. Taking medication as prescribed: Yes. Toleration medication: Yes. Family/Significant other contact made: No, will contact:  No one indicated. Patient understands diagnosis: Yes. Discussing patient identified problems/goals with staff: Yes. Medical problems stabilized or resolved: Yes. Denies suicidal/homicidal ideation: Yes. Issues/concerns per patient self-inventory: No. Other: n/a  New problem(s) identified: No, Describe:  n/a  New Short Term/Long Term Goal(s):  No specific goal but mentioned interest in a rehab program.  Patient Goals:  No specific goal other than to spend a few nights for depression and to detox.  Discharge Plan or Barriers:   Reason for Continuation of Hospitalization: Withdrawal symptoms  Estimated Length of Stay:  Last 3 [place] Suicide Severity Risk Score: [place] ED from 12/30/2021 in [facility] ED from 07/11/2020 in [place] DEPT Admission (Discharged) from OP Visit from 10/07/2018 in [place] High Risk No Risk No Risk       Last PHQ 2/9 Scores:    12/30/2021    5:57 PM 06/18/2016    3:26 PM 04/17/2016    3:06 PM  Depression screen PHQ 2/9  Decreased Interest 1    Down, Depressed, Hopeless 1    PHQ - 2 Score 2    Altered sleeping 0    Tired, decreased energy 1    Change in appetite 1    Feeling bad or failure about yourself  1    Trouble concentrating 1    Moving slowly or fidgety/restless 0    Suicidal thoughts 1    PHQ-9 Score 7    Difficult doing work/chores Somewhat difficult       Information is confidential and restricted. Go to Review Flowsheets to unlock data.    Scribe for Treatment Team: Kerri Perches, Marlinda Mike 12/31/2021 3:42 PM

## 2021-12-31 NOTE — ED Notes (Signed)
Pt sleeping at present, no distress noted.  Monitoring for safety. 
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
## 2021-12-31 NOTE — ED Notes (Signed)
Pt currently in bed sleeping. He is in view of nursing station. Breathing even and unlabored.  Staff will cont to monitor for safety.

## 2021-12-31 NOTE — ED Notes (Signed)
Pt was given a muffin and juice for breakfast.  

## 2021-12-31 NOTE — ED Notes (Signed)
Patient admitted to [facility] from [facility] for detox from poly substance use including BZD and opiates.  Patient calm and cooperative, soft spoken and organized in thought.  Patient was malodorous and given new scrubs and toiletries.  He is presently in the shower.  Patient had also asked for lunch and food provided to him.  Patient does not appear to be experiencing withdrawal s/s at this time.  Patient denied avh shi or plan.  Will monitor and provide safe environment.

## 2021-12-31 NOTE — ED Notes (Signed)
Pt was offered Thiamine IM medication.  He was instructed on what it was for and that the MD ordered it. Pt declined the IM medication.

## 2022-01-01 DIAGNOSIS — F1994 Other psychoactive substance use, unspecified with psychoactive substance-induced mood disorder: Secondary | ICD-10-CM | POA: Diagnosis not present

## 2022-01-01 DIAGNOSIS — F32A Depression, unspecified: Secondary | ICD-10-CM | POA: Diagnosis not present

## 2022-01-01 DIAGNOSIS — Z20822 Contact with and (suspected) exposure to covid-19: Secondary | ICD-10-CM | POA: Diagnosis not present

## 2022-01-01 LAB — GC/CHLAMYDIA PROBE AMP (~~LOC~~) NOT AT ARMC
Chlamydia: NEGATIVE
Comment: NEGATIVE
Comment: NORMAL
Neisseria Gonorrhea: NEGATIVE

## 2022-01-01 MED ORDER — MELATONIN 3 MG PO TABS
3.0000 mg | ORAL_TABLET | Freq: Every day | ORAL | Status: DC
  Administered 2022-01-01 – 2022-01-02 (×2): 3 mg via ORAL
  Filled 2022-01-01 (×2): qty 1

## 2022-01-01 NOTE — ED Provider Notes (Signed)
Patient was seen something for sleep.  QTc has been elevated, will request repeat ECG 01/03/2019 3 in the morning.  If QTc has normalized, could consider retrial of medication for mood stabilization for sedative. In the interim, will order melatonin 3 mg at bedtime tonight

## 2022-01-01 NOTE — BH Specialist Note (Signed)
LCSW Progress Note   LCSW met with pt to further discuss his goals while here at the [facility] and discuss options for residential substance use programs.  Pt showed an interest in [place].  1330 - LCSW conducted a group process session to address how intrinsic and extrinsic motivation plays a role in their recovery.  Pt was in attendance and engaged in the discussion but appeared to present with a tired affect and would doze off.  Pt was easy to arouse and re-engage, but his attention span appeared to be impacted by the fatigue he expressed about.  Omelia Blackwater, MSW, [place] phone [phone] work mobile

## 2022-01-01 NOTE — ED Notes (Signed)
Pt asleep in bed. Respirations even and unlabored. Monitoring for safety. 

## 2022-01-01 NOTE — ED Notes (Signed)
In a group with Social Worker 

## 2022-01-01 NOTE — ED Notes (Signed)
Patient is asleep in bed without issue or complaint.  Will monitor.   °

## 2022-01-01 NOTE — ED Notes (Signed)
Snacks given 

## 2022-01-01 NOTE — ED Notes (Signed)
Pt is in the bed sleeping. Respirations are even and unlabored. No acute distress noted. Will continue to monitor for safety. 
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
## 2022-01-01 NOTE — ED Notes (Signed)
Pt resting in bed. A&O x4, calm and cooperative. Denies current SI/HI/AVH. No signs of distress noted. Monitoring for safety. 

## 2022-01-01 NOTE — Group Note (Signed)
Group Topic: Healthy Self Image and Positive Change  Group Date: 01/01/2022 Start Time: 1215 End Time: 1240 Facilitators: Leana Roe  Department: [facility]  Number of Participants: 2  Group Focus: goals/reality orientation Treatment Modality:  Behavior Modification Therapy Interventions utilized were patient education Purpose: enhance coping skills  Name: Colton Blair Date of Birth: Oct 19, 1995  MR: [id number]    Level of Participation: active Quality of Participation: attentive and cooperative Interactions with others: gave feedback Mood/Affect: appropriate Triggers (if applicable): n/a Cognition: coherent/clear Progress: Moderate Response: He planned on getting better and staying clean for his kids, encouragements given. 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

## 2022-01-02 DIAGNOSIS — F1994 Other psychoactive substance use, unspecified with psychoactive substance-induced mood disorder: Secondary | ICD-10-CM | POA: Diagnosis not present

## 2022-01-02 DIAGNOSIS — F32A Depression, unspecified: Secondary | ICD-10-CM | POA: Diagnosis not present

## 2022-01-02 DIAGNOSIS — Z20822 Contact with and (suspected) exposure to covid-19: Secondary | ICD-10-CM | POA: Diagnosis not present

## 2022-01-02 LAB — TROPONIN I (HIGH SENSITIVITY): Troponin I (High Sensitivity): 8 ng/L (ref <18)

## 2022-01-02 NOTE — ED Notes (Signed)
Pt Refused EKG 

## 2022-01-02 NOTE — ED Notes (Signed)
Pt is in the bed sleeping. Respirations are even and unlabored. No acute distress noted. Will continue to monitor for safety. 

## 2022-01-02 NOTE — ED Notes (Signed)
Patient remains in room resting in bed.  No somatic complaint or distress noted.  Patient is calm and pleasant makes needs known.  Denies avh shi or plan and will seek staff if overwhelmed.  Will monitor and provide support as needed.

## 2022-01-02 NOTE — ED Notes (Signed)
Snacks given 

## 2022-01-02 NOTE — ED Notes (Signed)
In a group meeting 

## 2022-01-02 NOTE — ED Notes (Signed)
Pt asleep in bed. Respirations even and unlabored. Monitoring for safety. 

## 2022-01-02 NOTE — ED Provider Notes (Signed)
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 01/02/2022, 12:04 PM   [facility] [address] [place], [place] [address] Dept: [phone] Dept Fax: [phone]

## 2022-01-03 ENCOUNTER — Encounter (HOSPITAL_COMMUNITY): Payer: Self-pay | Admitting: Behavioral Health

## 2022-01-03 DIAGNOSIS — F1120 Opioid dependence, uncomplicated: Secondary | ICD-10-CM | POA: Diagnosis present

## 2022-01-03 DIAGNOSIS — F1994 Other psychoactive substance use, unspecified with psychoactive substance-induced mood disorder: Secondary | ICD-10-CM | POA: Diagnosis not present

## 2022-01-03 DIAGNOSIS — F1290 Cannabis use, unspecified, uncomplicated: Secondary | ICD-10-CM | POA: Diagnosis present

## 2022-01-03 DIAGNOSIS — F17200 Nicotine dependence, unspecified, uncomplicated: Secondary | ICD-10-CM | POA: Diagnosis present

## 2022-01-03 DIAGNOSIS — Z20822 Contact with and (suspected) exposure to covid-19: Secondary | ICD-10-CM | POA: Diagnosis not present

## 2022-01-03 DIAGNOSIS — R9431 Abnormal electrocardiogram [ECG] [EKG]: Secondary | ICD-10-CM

## 2022-01-03 DIAGNOSIS — F32A Depression, unspecified: Secondary | ICD-10-CM | POA: Diagnosis not present

## 2022-01-03 HISTORY — DX: Abnormal electrocardiogram (ECG) (EKG): R94.31

## 2022-01-03 MED ORDER — THIAMINE HCL 100 MG PO TABS: 100.0000 mg | ORAL_TABLET | Freq: Every day | ORAL | 0 refills | Status: DC

## 2022-01-03 MED ORDER — ADULT MULTIVITAMIN W/MINERALS CH: 1.0000 | ORAL_TABLET | Freq: Every day | ORAL | 0 refills | Status: DC

## 2022-01-03 MED ORDER — MELATONIN 3 MG PO TABS: 3.0000 mg | ORAL_TABLET | Freq: Every day | ORAL | 0 refills | Status: DC

## 2022-01-03 NOTE — ED Notes (Signed)
Pt provided breakfast this morning.  He is Aox4.  Reports he is thinking about discharging but would need to speak with family.  Denies pain, SI, HI, and AVH.  Reports fair sleep states "I was tossing and turning."  Reports he is in a good mood this morning.  Breathing is even and unlabored.  Will continue to monitor for safety.
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
## 2022-01-03 NOTE — Discharge Instructions (Addendum)
Good morning, Colton Blair!  It is imperative that you follow through with any treatment recommendations that were given to you during your stay here.  [place] does have availability, and you can always go the location in person to speak with admissions as well as call them.  This is very important.  Below is a list of other resources for substance use treatment including other types of rehab programs that are out there.  If [facility] does not work out, you could have a successful chance with either [facility] or one of the [facility] [employer] [facility] in [place], [place], and [place].  Those numbers are provided below as well.  [facility] in [place] (if that area is not a trigger for you) has availability right now as well.  You would need to call to speak with Sheena who does the intake for that program.  They can also arrange transportation if you can't find any.  You can always come to:  [facility] [place], [place], [address] [phone] phone   New Patient Assessment/Therapy Walk-Ins:  Monday and Wednesday: 8 am until slots are full. Every 1st and 2nd Fridays of the month: 1 pm - 5 pm.  NO ASSESSMENT/THERAPY WALK-INS ON [place]  New Patient Assessment/Medication Management Walk-Ins:  Monday - Friday:  8 am - 11 am.  For all walk-ins, we ask that you arrive by 7:30 am because patients will be seen in the order of arrival.  Availability is limited; therefore, you may not be seen on the same day that you walk-in.  Our goal is to serve and meet the needs of our community to the best of our ability.     SUBSTANCE USE TREATMENT for Medicaid and State Funded/IPRS  [facility]) [place], [place], [address] [phone] phone NOTE: [facility] is no longer offering IOP services.  Serves those who are low-income or have no insurance.  [facility] [address], [place], [place], [address] [phone] phone [phone] fax NOTE: Does have Substance Abuse-Intensive Outpatient Program [facility]) as well as transitional housing if eligible.  [place], [place], [address] [phone] phone [phone] fax  [place] [phone] [place], [place], [address] [phone] phone [phone] fax   [place] (men only at this campus) [place], [place] [address] [phone]  [facility] in [place]: Executive Director [phone] Lavell Luster: Intake ([place] (men's program) [address] [place], [place] [address] [place]. [address] [place] [address] [phone]  [place] [address] [place] [address] [phone] II Charna Busman [address] [place], [place] [address] [phone], [place] [address] [phone]  [facility] (Rehab for men only) [address] [place], [place] [address] [phone]    MEDICAL DETOX/RESIDENTIAL TREATMENT- MEDICAID/IPRS:  [place] [address] [place], [place] [address]  [id number]  [place] [place], [place] [address] [phone]  [facility] [address] [place], [place] [address] [phone]  [place], [place] [address] [phone]  ((For admissions to these three [facility]  facilities during weekday days and possibly other times, contact [place], phone: [phone]; fax: [phone]))  [facility] (detox for men and women) [address] [place], [place] [address] [phone]   OUTPATIENT PROGRAMS:  [facility]) [address], [place] [address] [phone]  ((CD-IOP is currently not operational; Opioid replacement clinic is operational))   [facility] (Medicaid & IPRS for [facility] residents) [address] [place], [place] [address] [phone]  ((CD-IOP; new clients must go through walk-in clinic))   [facility] (Medicaid & IPRS for [facility] residents) [address] [place], [place] [address] [phone]  ((CD-IOP))   [facility] (Medicaid for [employer] and [employer]; some private insurance) [address] [place], [place] [address] [phone]  ((CD-IOP))   [facility] & private insurance) [phone] [address] [place], [place] [address] [phone]  ((CD-IOP (morning & evening programs); individual therapy))   HALFWAY HOUSES:  [facility] [phone]  [email or web address]   OPIOID REPLACEMENT PROVIDERS:  [facility] ([facility]) [address], [place] [address] [phone]  [facility] [address] [place], [place] [address] [phone]  [facility] [address] [place], [place] [address] [phone]   12 STEP PROGRAMS:  Alcoholics Anonymous of [place] [employer].be  Narcotics Anonymous of [place] [employer]dk  Al-Anon of [place], [place] [email or web address]/find-meetings.html  Nar-Anon https://nar-anon.org/find-[email or web address]-meeting

## 2022-01-03 NOTE — ED Notes (Signed)
Patient A&O x 4, ambulatory. Patient discharged in no acute distress. Patient denied SI/HI, A/VH upon discharge. Patient verbalized understanding of all discharge instructions explained by staff, to include follow up appointments, RX's and safety plan. Patient reported mood 10/10. Pt belongings returned to patient from locker #21 intact. Patient escorted to lobby via staff with bus pass in hand. Safety maintained.

## 2022-01-03 NOTE — ED Notes (Signed)
Pt is in the bed sleeping. Respirations are even and unlabored. No acute distress noted. Will continue to monitor for safety. 

## 2022-01-19 ENCOUNTER — Ambulatory Visit (HOSPITAL_COMMUNITY)
Admission: EM | Admit: 2022-01-19 | Discharge: 2022-01-19 | Disposition: A | Discharge status: Home / Self Care | Payer: No Payment, Other | Attending: Psychiatry | Admitting: Psychiatry

## 2022-01-19 DIAGNOSIS — F1410 Cocaine abuse, uncomplicated: Secondary | ICD-10-CM

## 2022-01-19 DIAGNOSIS — F1414 Cocaine abuse with cocaine-induced mood disorder: Secondary | ICD-10-CM | POA: Insufficient documentation

## 2022-01-19 DIAGNOSIS — F1994 Other psychoactive substance use, unspecified with psychoactive substance-induced mood disorder: Secondary | ICD-10-CM

## 2022-01-19 NOTE — ED Triage Notes (Signed)
Pt presents to [facility] voluntarily seeking detox and substance use treatment for cocaine. Pt reports last use was 2 days ago. Pt reports using cocaine 3x a week. Pt denies SI/HI and AVH

## 2022-01-19 NOTE — Discharge Instructions (Addendum)
Take all medications as prescribed. Keep all follow-up appointments as scheduled.  Do not consume alcohol or use illegal drugs while on prescription medications. Report any adverse effects from your medications to your primary care provider promptly.  In the event of recurrent symptoms or worsening symptoms, call 911, a crisis hotline, or go to the nearest emergency department for evaluation.   

## 2022-01-19 NOTE — ED Provider Notes (Signed)
Behavioral Health Urgent Care Medical Screening Exam  Patient Name: Colton Blair MRN: [id number] Date of Evaluation: 01/19/22 Chief Complaint:   Diagnosis:  Final diagnoses:  Cocaine abuse ([place])  Substance induced mood disorder ([place])    History of Present illness: Colton Blair is a 26 y.o. male.  Presents to [facility] urgent care requesting detox from cocaine and ecstasy.  States he last used 2 days ago and was hopeful to get treatment again.  States he was recently discharged from [facility])  denied that he is followed up with outpatient resources.  He is denying suicidal or homicidal ideations.  Denies auditory visual hallucinations. Patient stated " I just need to get my mental health together."  During evaluation Colton Blair is sitting in no acute distress. She is alert/oriented x 4; calm/cooperative; and mood congruent with affect.He is speaking in a clear tone at moderate volume, and normal pace; with good eye contact. His thought process is coherent and relevant; There is no indication that he is currently responding to internal/external stimuli or experiencing delusional thought content; and he has denied suicidal/self-harm/homicidal ideation, psychosis, and paranoia. Patient has remained calm throughout assessment and has answered questions appropriately.    At this time Colton Blair is educated and verbalizes understanding of mental health resources and other crisis services in the community. He is instructed to call 911 and present to the nearest emergency room should he experience any suicidal/homicidal ideation, auditory/visual/hallucinations, or detrimental worsening of his mental health condition. he was a also advised by Probation officer that he could call the toll-free phone on insurance card to assist with identifying in network counselors and agencies or number on back of Medicaid card to speak with care coordinator   Psychiatric Specialty  Exam  Presentation  General Appearance:Appropriate for Environment; Casual; Fairly Groomed  Eye Contact:Good  Speech:Clear and Coherent; Normal Rate  Speech Volume:Normal  Handedness:Right   Mood and Affect  Mood: Euthymic  Affect: Appropriate; Congruent; Full Range   Thought Process  Thought Processes: Coherent; Goal Directed; Linear  Descriptions of Associations:Intact  Orientation:Full (Time, Place and Person)  Thought Content:Logical; WDL  Diagnosis of Schizophrenia or Schizoaffective disorder in past: No   Hallucinations:None  Ideas of Reference:None  Suicidal Thoughts:No (Contracted to safety) Without Intent; Without Plan; Without Access to Means  Homicidal Thoughts:No   Sensorium  Memory: Immediate Good  Judgment: Fair  Insight: Shallow   Executive Functions  Concentration: Good  Attention Span: Good  Recall: Good  Fund of Knowledge: Fair  Language: Good   Psychomotor Activity  Psychomotor Activity: Psychomotor Retardation   Assets  Assets: Communication Skills; Desire for Improvement; Housing; Leisure Time   Sleep  Sleep: Fair  Number of hours: No data recorded  No data recorded  Physical Exam: Physical Exam Vitals and nursing note reviewed.  HENT:     Head: Normocephalic and atraumatic.  Cardiovascular:     Rate and Rhythm: Normal rate and regular rhythm.  Pulmonary:     Effort: Pulmonary effort is normal.  Neurological:     Mental Status: He is oriented to person, place, and time.  Psychiatric:        Mood and Affect: Mood normal.        Thought Content: Thought content normal.    Review of Systems  Eyes: Negative.   Cardiovascular: Negative.   Gastrointestinal: Negative.   Genitourinary: Negative.   Psychiatric/Behavioral:  Positive for substance abuse. Negative for depression and suicidal ideas. The  patient is not nervous/anxious.   All other systems reviewed and are negative.  Blood pressure  (!) 117/93, pulse 85, resp. rate 18, SpO2 100 %. There is no height or weight on file to calculate BMI.  Musculoskeletal: Strength & Muscle Tone: within normal limits Gait & Station: normal Patient leans: N/A   [facility] MSE Discharge Disposition for Follow up and Recommendations: Based on my evaluation the patient does not appear to have an emergency medical condition and can be discharged with resources and follow up care in outpatient services for Medication Management, Substance Abuse Intensive Outpatient Program, and Individual Therapy   Oneta Rack, NP 01/19/2022, 8:52 AM

## 2022-01-20 ENCOUNTER — Ambulatory Visit (INDEPENDENT_AMBULATORY_CARE_PROVIDER_SITE_OTHER): Payer: No Payment, Other | Admitting: Psychiatry

## 2022-01-20 ENCOUNTER — Ambulatory Visit (HOSPITAL_COMMUNITY)
Admission: EM | Admit: 2022-01-20 | Discharge: 2022-01-20 | Disposition: A | Discharge status: Other Acute Inpatient Hospital | Payer: No Payment, Other

## 2022-01-20 ENCOUNTER — Other Ambulatory Visit: Payer: Self-pay

## 2022-01-20 ENCOUNTER — Encounter (HOSPITAL_COMMUNITY): Payer: Self-pay | Admitting: Psychiatry

## 2022-01-20 DIAGNOSIS — F1994 Other psychoactive substance use, unspecified with psychoactive substance-induced mood disorder: Secondary | ICD-10-CM

## 2022-01-20 DIAGNOSIS — F4310 Post-traumatic stress disorder, unspecified: Secondary | ICD-10-CM | POA: Diagnosis not present

## 2022-01-20 MED ORDER — QUETIAPINE FUMARATE 50 MG PO TABS
50.0000 mg | ORAL_TABLET | Freq: Every day | ORAL | 3 refills | Status: DC
  Filled 2022-01-20: qty 30, 30d supply, fill #0

## 2022-01-20 MED ORDER — PRAZOSIN HCL 1 MG PO CAPS
1.0000 mg | ORAL_CAPSULE | Freq: Every day | ORAL | 3 refills | Status: DC
  Filled 2022-01-20: qty 30, 30d supply, fill #0
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
## 2022-01-20 NOTE — Discharge Summary (Signed)
Pt was triaged and sent upstairs to outpatient. Pt is discharged at this time.

## 2022-01-20 NOTE — BH Assessment (Signed)
Pt seen at [facility] yesterday and recommended for outpatient services at [facility]. Pt presented to [facility] again instead of going upstairs for outpatient treatment. Pt acknowledges that he is suppose to be upstairs to receive treatment. Pt denies SI, HI, AVH. Pt is calm and not in a crisis. Pt agrees to go upstairs for treatment. NP notified.

## 2022-01-21 ENCOUNTER — Other Ambulatory Visit: Payer: Self-pay

## 2022-02-04 ENCOUNTER — Other Ambulatory Visit: Payer: Self-pay

## 2022-02-08 ENCOUNTER — Ambulatory Visit (HOSPITAL_COMMUNITY)
Admission: EM | Admit: 2022-02-08 | Discharge: 2022-02-08 | Disposition: A | Discharge status: Home / Self Care | Payer: Self-pay | Attending: Physician Assistant | Admitting: Physician Assistant

## 2022-02-08 ENCOUNTER — Ambulatory Visit (INDEPENDENT_AMBULATORY_CARE_PROVIDER_SITE_OTHER): Payer: Self-pay

## 2022-02-08 ENCOUNTER — Encounter (HOSPITAL_COMMUNITY): Payer: Self-pay | Admitting: *Deleted

## 2022-02-08 ENCOUNTER — Other Ambulatory Visit: Payer: Self-pay

## 2022-02-08 DIAGNOSIS — M545 Low back pain, unspecified: Secondary | ICD-10-CM

## 2022-02-08 DIAGNOSIS — M62830 Muscle spasm of back: Secondary | ICD-10-CM

## 2022-02-08 LAB — POCT URINALYSIS DIPSTICK, ED / UC
Bilirubin Urine: NEGATIVE
Glucose, UA: NEGATIVE mg/dL
Hgb urine dipstick: NEGATIVE
Leukocytes,Ua: NEGATIVE
Nitrite: NEGATIVE
Protein, ur: NEGATIVE mg/dL
Specific Gravity, Urine: 1.025 (ref 1.005–1.030)
Urobilinogen, UA: 1 mg/dL (ref 0.0–1.0)
pH: 7 (ref 5.0–8.0)

## 2022-02-08 MED ORDER — IBUPROFEN 600 MG PO TABS: 600.0000 mg | ORAL_TABLET | Freq: Three times a day (TID) | ORAL | 0 refills | Status: DC

## 2022-02-08 NOTE — Discharge Instructions (Addendum)
Advised to use ice, 10 minutes on 20 minutes off, 3-4 times throughout the evening to help reduce pain and discomfort. Advise use ibuprofen 600 mg every 8 hours with food on a regular basis to decrease pain and swelling. Advised to follow-up PCP or return to urgent care if symptoms fail to improve.
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
## 2022-02-08 NOTE — ED Triage Notes (Signed)
Pt reports lower back pain for one week. Pt sent by his job for x-ray.

## 2022-03-11 ENCOUNTER — Ambulatory Visit (HOSPITAL_COMMUNITY)
Admission: EM | Admit: 2022-03-11 | Discharge: 2022-03-11 | Disposition: A | Discharge status: Home / Self Care | Payer: Self-pay | Attending: Physician Assistant | Admitting: Physician Assistant

## 2022-03-11 ENCOUNTER — Encounter (HOSPITAL_COMMUNITY): Payer: Self-pay

## 2022-03-11 DIAGNOSIS — K0889 Other specified disorders of teeth and supporting structures: Secondary | ICD-10-CM

## 2022-03-11 DIAGNOSIS — K047 Periapical abscess without sinus: Secondary | ICD-10-CM

## 2022-03-11 DIAGNOSIS — S025XXA Fracture of tooth (traumatic), initial encounter for closed fracture: Secondary | ICD-10-CM

## 2022-03-11 MED ORDER — NAPROXEN 500 MG PO TABS: 500.0000 mg | ORAL_TABLET | Freq: Two times a day (BID) | ORAL | 0 refills | Status: DC

## 2022-03-11 MED ORDER — AMOXICILLIN 500 MG PO CAPS: 500.0000 mg | ORAL_CAPSULE | Freq: Three times a day (TID) | ORAL | 0 refills | Status: DC
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
## 2022-03-11 NOTE — ED Triage Notes (Signed)
Pt states cracked his rt lower tooth a week ago and now having pain and swelling. States taking BC powders with no relief.

## 2023-02-15 ENCOUNTER — Emergency Department (HOSPITAL_COMMUNITY)
Admission: EM | Admit: 2023-02-15 | Discharge: 2023-02-15 | Disposition: A | Discharge status: Home / Self Care | Payer: Self-pay | Attending: Emergency Medicine | Admitting: Emergency Medicine

## 2023-02-15 ENCOUNTER — Encounter (HOSPITAL_COMMUNITY): Payer: Self-pay

## 2023-02-15 ENCOUNTER — Other Ambulatory Visit: Payer: Self-pay

## 2023-02-15 DIAGNOSIS — M791 Myalgia, unspecified site: Secondary | ICD-10-CM | POA: Insufficient documentation

## 2023-02-15 DIAGNOSIS — R5381 Other malaise: Secondary | ICD-10-CM | POA: Insufficient documentation

## 2023-02-15 NOTE — ED Triage Notes (Addendum)
Patient states "I need to get back to myself and get my body right". Patient states he had a long night but does not give detail. Patient states his body feels weak.  Reports alcohol use tonight. Denies SI/HI.

## 2023-02-15 NOTE — ED Notes (Addendum)
Per lab no blood received for patient.  Patient reports blood has been drawn, states does not want blood drawn again.  Provider made aware.

## 2023-02-15 NOTE — ED Provider Notes (Signed)
[place] EMERGENCY DEPARTMENT AT [facility] Provider Note   CSN: [id number] Arrival date & time: 02/15/23  0407     History  Chief Complaint  Patient presents with   Generalized Body Aches    Colton Blair is a 27 y.o. male.  27 y/o male presents to the ED for evaluation. Complains of feeling "low energy" and like his body is "flowy". Began feeling this way tonight. No modifying factors or medications utilized. Does endorse ETOH and ecstasy use tonight. Patient reports not eating and drinking fluids as he should; denies issues with access to food or homelessness.  The history is provided by the patient. No language interpreter was used.       Home Medications Prior to Admission medications   Medication Sig Start Date End Date Taking? Authorizing Provider  amoxicillin (AMOXIL) 500 MG capsule Take 1 capsule (500 mg total) by mouth 3 (three) times daily. 03/11/22   Tomi Bamberger, PA-C  melatonin 3 MG TABS tablet Take 1 tablet (3 mg total) by mouth at bedtime. 01/03/22   Princess Bruins, DO  Multiple Vitamin (MULTIVITAMIN WITH MINERALS) TABS tablet Take 1 tablet by mouth daily. 01/04/22 02/03/22  Princess Bruins, DO  naproxen (NAPROSYN) 500 MG tablet Take 1 tablet (500 mg total) by mouth 2 (two) times daily. 03/11/22   Tomi Bamberger, PA-C  prazosin (MINIPRESS) 1 MG capsule Take 1 capsule (1 mg total) by mouth at bedtime. 01/20/22   Shanna Cisco, NP  QUEtiapine (SEROQUEL) 50 MG tablet Take 1 tablet (50 mg total) by mouth at bedtime. 01/20/22   Shanna Cisco, NP  thiamine (VITAMIN B1) 100 MG tablet Take 1 tablet (100 mg total) by mouth daily. 01/03/22 02/02/22  Princess Bruins, DO      Allergies    Patient has no known allergies.    Review of Systems   Review of Systems Ten systems reviewed and are negative for acute change, except as noted in the HPI.    Physical Exam Updated Vital Signs BP 116/85 (BP Location: Left Arm)   Pulse 90   Temp 97.6 F  (36.4 C)   Resp (!) 24   SpO2 100%   Physical Exam Vitals and nursing note reviewed.  Constitutional:      General: He is not in acute distress.    Appearance: He is well-developed. He is not diaphoretic.     Comments: Respirations even and unlabored  HENT:     Head: Normocephalic and atraumatic.  Eyes:     General: No scleral icterus.    Conjunctiva/sclera: Conjunctivae normal.  Cardiovascular:     Rate and Rhythm: Normal rate and regular rhythm.     Pulses: Normal pulses.     Heart sounds: Murmur heard.  Pulmonary:     Effort: Pulmonary effort is normal. No respiratory distress.     Comments: Respirations even and unlabored. Lungs CTAB. Musculoskeletal:        General: Normal range of motion.     Cervical back: Normal range of motion.  Skin:    General: Skin is warm and dry.     Coloration: Skin is not pale.     Findings: No erythema or rash.  Neurological:     Mental Status: He is alert and oriented to person, place, and time.  Psychiatric:        Mood and Affect: Affect is flat.        Speech: Speech normal.  Behavior: Behavior is withdrawn.        Thought Content: Thought content does not include homicidal or suicidal ideation.     ED Results / Procedures / Treatments   Labs (all labs ordered are listed, but only abnormal results are displayed) Labs Reviewed - No data to display   EKG EKG Interpretation Date/Time:  Sunday February 15 2023 04:07:32 EST Ventricular Rate:  92 PR Interval:  148 QRS Duration:  168 QT Interval:  440 QTC Calculation: 544 R Axis:   129  Text Interpretation: ** Poor data quality, interpretation may be adversely affected Normal sinus rhythm Right bundle branch block Possible Anterior infarct , age undetermined Abnormal ECG When compared with ECG of 15-Feb-2023 04:05, PREVIOUS ECG IS PRESENT Confirmed by Tanda Rockers ([id number]) on 02/15/2023 6:51:25 AM  Radiology No results found.  Procedures Procedures    Medications  Ordered in ED Medications - No data to display  ED Course/ Medical Decision Making/ A&P Clinical Course as of 02/15/23 0706  Sun Feb 15, 2023  0706 Called lab and they are unable to locate the patient's blood work that was collected in triage.  He is not amenable to being stuck again for completion of ordered labs.  He has been hemodynamically stable, is overall well-appearing.  Plan for discharge and patient able to follow-up with his primary care doctor on an outpatient basis as needed for any persistent complaints. [KH]    Clinical Course User Index [KH] Antony Madura, PA-C                                 Medical Decision Making Amount and/or Complexity of Data Reviewed Labs: ordered.   This patient presents to the ED for concern of malaise, this involves an extensive number of treatment options, and is a complaint that carries with it a high risk of complications and morbidity.  The differential diagnosis includes hypoglycemia vs electrolyte derangement vs anemia vs viral illness vs polysubstance use   Co morbidities that complicate the patient evaluation  Polysubstance abuse Congenital heart anomaly   Additional history obtained:  External records from outside source obtained and reviewed including normal TSH level in September 2023.   Cardiac Monitoring:  The patient was maintained on a cardiac monitor.  I personally viewed and interpreted the cardiac monitored which showed an underlying rhythm of: NSR   Medicines ordered and prescription drug management:  I have reviewed the patients home medicines and have made adjustments as needed   Test Considered:  UDS   Problem List / ED Course:  Patient presenting to the emergency department requesting to be checked out for feeling that his body is "floaty" and low energy.  He has no associated fever.  No vitals to raise concern for SIRS/sepsis.  He denies any recent fevers or infectious symptoms.  Does state that he has  been drinking alcohol today and using ecstasy.  He has a long documented history of polysubstance abuse; however, has clear speech, follows commands, making good eye contact. Ambulatory in the department without difficulty. Initially basic labs ordered for screening; however, lab has misplaced his blood samples drawn in triage. Patient not open to having blood redrawn. Do not feel there is an emergent need to complete imaging. Will have patient f/u further with his PCP on an outpatient basis.   Reevaluation:  After the interventions noted above, I reevaluated the patient and found that they have :stayed  the same   Social Determinants of Health:  Illicit drug use   Dispostion:  After consideration of the diagnostic results and the patients response to treatment, I feel that the patent would benefit from PCP f/u as outpatient. Return precautions discussed and provided. Patient discharged in stable condition with no unaddressed concerns.          Final Clinical Impression(s) / ED Diagnoses Final diagnoses:  Malaise    Rx / DC Orders ED Discharge Orders     None         Antony Madura, PA-C 02/15/23 0719    Sloan Leiter, DO 02/15/23 [phone]

## 2023-03-31 ENCOUNTER — Ambulatory Visit (HOSPITAL_COMMUNITY)
Admission: EM | Admit: 2023-03-31 | Discharge: 2023-04-02 | Disposition: A | Discharge status: Other Acute Inpatient Hospital | Payer: No Payment, Other | Attending: Nurse Practitioner | Admitting: Nurse Practitioner

## 2023-03-31 DIAGNOSIS — F1090 Alcohol use, unspecified, uncomplicated: Secondary | ICD-10-CM | POA: Insufficient documentation

## 2023-03-31 DIAGNOSIS — Z5900 Homelessness unspecified: Secondary | ICD-10-CM | POA: Insufficient documentation

## 2023-03-31 DIAGNOSIS — R45851 Suicidal ideations: Secondary | ICD-10-CM

## 2023-03-31 DIAGNOSIS — F1994 Other psychoactive substance use, unspecified with psychoactive substance-induced mood disorder: Secondary | ICD-10-CM

## 2023-03-31 DIAGNOSIS — F1490 Cocaine use, unspecified, uncomplicated: Secondary | ICD-10-CM | POA: Insufficient documentation

## 2023-03-31 DIAGNOSIS — F17210 Nicotine dependence, cigarettes, uncomplicated: Secondary | ICD-10-CM | POA: Insufficient documentation

## 2023-03-31 DIAGNOSIS — F1290 Cannabis use, unspecified, uncomplicated: Secondary | ICD-10-CM | POA: Insufficient documentation

## 2023-03-31 DIAGNOSIS — F4310 Post-traumatic stress disorder, unspecified: Secondary | ICD-10-CM

## 2023-03-31 DIAGNOSIS — Z9152 Personal history of nonsuicidal self-harm: Secondary | ICD-10-CM | POA: Insufficient documentation

## 2023-03-31 DIAGNOSIS — R45850 Homicidal ideations: Secondary | ICD-10-CM | POA: Insufficient documentation

## 2023-03-31 DIAGNOSIS — F1910 Other psychoactive substance abuse, uncomplicated: Secondary | ICD-10-CM

## 2023-03-31 DIAGNOSIS — F1914 Other psychoactive substance abuse with psychoactive substance-induced mood disorder: Secondary | ICD-10-CM | POA: Insufficient documentation

## 2023-03-31 DIAGNOSIS — F332 Major depressive disorder, recurrent severe without psychotic features: Secondary | ICD-10-CM

## 2023-03-31 DIAGNOSIS — Z79899 Other long term (current) drug therapy: Secondary | ICD-10-CM | POA: Insufficient documentation

## 2023-03-31 LAB — COMPREHENSIVE METABOLIC PANEL
ALT: 23 U/L (ref 0–44)
AST: 22 U/L (ref 15–41)
Albumin: 3.9 g/dL (ref 3.5–5.0)
Alkaline Phosphatase: 50 U/L (ref 38–126)
Anion gap: 9 (ref 5–15)
BUN: 10 mg/dL (ref 6–20)
CO2: 25 mmol/L (ref 22–32)
Calcium: 9.6 mg/dL (ref 8.9–10.3)
Chloride: 107 mmol/L (ref 98–111)
Creatinine, Ser: 1.01 mg/dL (ref 0.61–1.24)
GFR, Estimated: >60 mL/min (ref >60)
Glucose, Bld: 180 mg/dL — ABNORMAL HIGH (ref 70–99)
Potassium: 4.1 mmol/L (ref 3.5–5.1)
Sodium: 141 mmol/L (ref 135–145)
Total Bilirubin: 1.2 mg/dL — ABNORMAL HIGH (ref <1.2)
Total Protein: 6.4 g/dL — ABNORMAL LOW (ref 6.5–8.1)

## 2023-03-31 LAB — CBC WITH DIFFERENTIAL/PLATELET
Abs Immature Granulocytes: 0.01 10*3/uL (ref 0.00–0.07)
Basophils Absolute: 0.1 10*3/uL (ref 0.0–0.1)
Basophils Relative: 1 %
Eosinophils Absolute: 0.4 10*3/uL (ref 0.0–0.5)
Eosinophils Relative: 5 %
HCT: 48.4 % (ref 39.0–52.0)
Hemoglobin: 16.7 g/dL (ref 13.0–17.0)
Immature Granulocytes: 0 %
Lymphocytes Relative: 31 %
Lymphs Abs: 2.3 10*3/uL (ref 0.7–4.0)
MCH: 32.6 pg (ref 26.0–34.0)
MCHC: 34.5 g/dL (ref 30.0–36.0)
MCV: 94.3 fL (ref 80.0–100.0)
Monocytes Absolute: 0.6 10*3/uL (ref 0.1–1.0)
Monocytes Relative: 9 %
Neutro Abs: 4 10*3/uL (ref 1.7–7.7)
Neutrophils Relative %: 54 %
Platelets: 383 10*3/uL (ref 150–400)
RBC: 5.13 MIL/uL (ref 4.22–5.81)
RDW: 13.5 % (ref 11.5–15.5)
WBC: 7.4 10*3/uL (ref 4.0–10.5)
nRBC: 0 % (ref 0.0–0.2)

## 2023-03-31 LAB — POCT URINE DRUG SCREEN - MANUAL ENTRY (I-SCREEN)
POC Amphetamine UR: NOT DETECTED
POC Buprenorphine (BUP): NOT DETECTED
POC Cocaine UR: NOT DETECTED
POC Marijuana UR: NOT DETECTED
POC Methadone UR: NOT DETECTED
POC Methamphetamine UR: POSITIVE — AB
POC Morphine: NOT DETECTED
POC Oxazepam (BZO): NOT DETECTED
POC Oxycodone UR: NOT DETECTED
POC Secobarbital (BAR): NOT DETECTED

## 2023-03-31 LAB — LIPID PANEL
Cholesterol: 120 mg/dL (ref 0–200)
HDL: 34 mg/dL — ABNORMAL LOW (ref >40)
LDL Cholesterol: 75 mg/dL (ref 0–99)
Total CHOL/HDL Ratio: 3.5 {ratio}
Triglycerides: 55 mg/dL (ref <150)
VLDL: 11 mg/dL (ref 0–40)

## 2023-03-31 LAB — HEMOGLOBIN A1C
Hgb A1c MFr Bld: 5.7 % — ABNORMAL HIGH (ref 4.8–5.6)
Mean Plasma Glucose: 116.89 mg/dL

## 2023-03-31 LAB — TSH: TSH: 0.332 u[IU]/mL — ABNORMAL LOW (ref 0.350–4.500)

## 2023-03-31 MED ORDER — ACETAMINOPHEN 325 MG PO TABS
650.0000 mg | ORAL_TABLET | Freq: Four times a day (QID) | ORAL | Status: DC | PRN
Start: 2023-03-31 — End: 2023-04-02

## 2023-03-31 MED ORDER — TRAZODONE HCL 50 MG PO TABS
50.0000 mg | ORAL_TABLET | Freq: Every evening | ORAL | Status: DC | PRN
  Administered 2023-04-01: 50 mg via ORAL
  Filled 2023-03-31: qty 1

## 2023-03-31 MED ORDER — ALUM & MAG HYDROXIDE-SIMETH 200-200-20 MG/5ML PO SUSP: 30.0000 mL | ORAL | Status: DC | PRN

## 2023-03-31 MED ORDER — QUETIAPINE FUMARATE 25 MG PO TABS
25.0000 mg | ORAL_TABLET | Freq: Every day | ORAL | Status: DC
Start: 2023-03-31 — End: 2023-04-01
  Administered 2023-03-31: 25 mg via ORAL
  Filled 2023-03-31: qty 1

## 2023-03-31 MED ORDER — MAGNESIUM HYDROXIDE 400 MG/5ML PO SUSP: 30.0000 mL | Freq: Every day | ORAL | Status: DC | PRN

## 2023-03-31 MED ORDER — HYDROXYZINE HCL 25 MG PO TABS: 25.0000 mg | ORAL_TABLET | Freq: Three times a day (TID) | ORAL | Status: DC | PRN

## 2023-03-31 MED ORDER — PRAZOSIN HCL 1 MG PO CAPS
1.0000 mg | ORAL_CAPSULE | Freq: Every day | ORAL | Status: DC
Start: 2023-03-31 — End: 2023-04-02
  Administered 2023-03-31 – 2023-04-01 (×2): 1 mg via ORAL
  Filled 2023-03-31 (×2): qty 1
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
## 2023-03-31 NOTE — ED Notes (Signed)
Lab tech from [facility] lab called back to say they had enough blood to run Free T3 and T4.

## 2023-03-31 NOTE — ED Notes (Signed)
Contacted lab to see if they had enough blood to draw  free T3 and T4. Lab stated they would call back and inform me.

## 2023-03-31 NOTE — ED Notes (Signed)
Patient A&Ox4. Patient reports SI with a plan to overdose on medication. Patient denies HI and AVH. Patient denies any physical complaints when asked. No acute distress noted. Support and encouragement provided. Routine safety checks conducted according to facility protocol. Encouraged patient to notify staff if thoughts of harm toward self or others arise. Patient verbalize understanding and agreement. Will continue to monitor for safety.
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
## 2023-04-01 LAB — T4, FREE: Free T4: 0.94 ng/dL (ref 0.61–1.12)

## 2023-04-01 NOTE — Discharge Instructions (Addendum)
Activity: as tolerated  Diet: heart healthy  Other: -Follow-up with your outpatient psychiatric provider -instructions on appointment date, time, and address (location) are provided to you in discharge paperwork.  -Take your psychiatric medications as prescribed at discharge - instructions are provided to you in the discharge paperwork  -If you are prescribed an atypical antipsychotic medication, we recommend that your outpatient psychiatrist follow routine screening for side effects within 3 months of discharge, including monitoring: AIMS scale, height, weight, blood pressure, fasting lipid panel, HbA1c, and fasting blood sugar.   -Recommend total abstinence from alcohol, tobacco, and other illicit drug use at discharge.   -If your psychiatric symptoms recur, worsen, or if you have side effects to your psychiatric medications, call your outpatient psychiatric provider, [phone] or go to the nearest emergency department.  -If suicidal thoughts occur, immediately call your outpatient psychiatric provider, 911, [phone] or go to the nearest emergency department.

## 2023-04-01 NOTE — ED Notes (Signed)
Patient is alert & oriented X 4 . He denies SI/HI or AVH at this time. Patient is calm and cooperative. He denies physical pain or discomfort. Patient is aware of the plan to transfer to [facility]. No additional needs at this time. We will continue to monitor for safety.

## 2023-04-01 NOTE — ED Notes (Signed)
Pt is currently sleeping, no distress noted, environmental check complete, will continue to monitor patient for safety.  

## 2023-04-01 NOTE — ED Notes (Signed)
Patient observed/assessed in bed/chair resting quietly appearing in no distress and verbalizing no complaints at this time. Will continue to monitor.  
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
## 2023-04-01 NOTE — ED Notes (Signed)
Patient observed resting quietly, eyes closed. Respirations equal and unlabored. Will continue to monitor for safety.  

## 2023-04-01 NOTE — Progress Notes (Signed)
Pt has been accepted to [facility] on 04/01/2023 Bed assignment: 404-2  Pt meets inpatient criteria per: Ambrose Finland MD   Attending Physician will be: Dr. Sarita Bottom, MD   Report can be called to:Adult unit: [phone]  Pt can arrive after consent   Care Team Notified: [facility]  Florentina Addison RN, Margely,Carrero MD, Fonnie Jarvis RN, [facility] LPN   [place] Dealie Koelzer LCSW-A   04/01/2023 1:54 PM

## 2023-04-02 ENCOUNTER — Other Ambulatory Visit: Payer: Self-pay

## 2023-04-02 ENCOUNTER — Encounter (HOSPITAL_COMMUNITY): Payer: Self-pay | Admitting: Psychiatry

## 2023-04-02 ENCOUNTER — Inpatient Hospital Stay (HOSPITAL_COMMUNITY)
Admission: AD | Admit: 2023-04-02 | Discharge: 2023-04-07 | DRG: 885 | Disposition: A | Discharge status: Home / Self Care | Payer: No Typology Code available for payment source | Source: Intra-hospital | Attending: Psychiatry | Admitting: Psychiatry

## 2023-04-02 DIAGNOSIS — R45851 Suicidal ideations: Secondary | ICD-10-CM | POA: Diagnosis present

## 2023-04-02 DIAGNOSIS — R9431 Abnormal electrocardiogram [ECG] [EKG]: Secondary | ICD-10-CM

## 2023-04-02 DIAGNOSIS — F17210 Nicotine dependence, cigarettes, uncomplicated: Secondary | ICD-10-CM | POA: Diagnosis present

## 2023-04-02 DIAGNOSIS — F1620 Hallucinogen dependence, uncomplicated: Principal | ICD-10-CM | POA: Diagnosis present

## 2023-04-02 DIAGNOSIS — Z560 Unemployment, unspecified: Secondary | ICD-10-CM | POA: Diagnosis not present

## 2023-04-02 DIAGNOSIS — F1510 Other stimulant abuse, uncomplicated: Secondary | ICD-10-CM | POA: Diagnosis present

## 2023-04-02 DIAGNOSIS — Z803 Family history of malignant neoplasm of breast: Secondary | ICD-10-CM

## 2023-04-02 DIAGNOSIS — Z833 Family history of diabetes mellitus: Secondary | ICD-10-CM | POA: Diagnosis not present

## 2023-04-02 DIAGNOSIS — R011 Cardiac murmur, unspecified: Secondary | ICD-10-CM

## 2023-04-02 DIAGNOSIS — Z9081 Acquired absence of spleen: Secondary | ICD-10-CM

## 2023-04-02 DIAGNOSIS — Z5941 Food insecurity: Secondary | ICD-10-CM | POA: Diagnosis not present

## 2023-04-02 DIAGNOSIS — F332 Major depressive disorder, recurrent severe without psychotic features: Principal | ICD-10-CM | POA: Diagnosis present

## 2023-04-02 DIAGNOSIS — F1994 Other psychoactive substance use, unspecified with psychoactive substance-induced mood disorder: Secondary | ICD-10-CM

## 2023-04-02 DIAGNOSIS — F329 Major depressive disorder, single episode, unspecified: Secondary | ICD-10-CM | POA: Diagnosis present

## 2023-04-02 DIAGNOSIS — Z8249 Family history of ischemic heart disease and other diseases of the circulatory system: Secondary | ICD-10-CM | POA: Diagnosis not present

## 2023-04-02 DIAGNOSIS — F4310 Post-traumatic stress disorder, unspecified: Principal | ICD-10-CM | POA: Diagnosis present

## 2023-04-02 DIAGNOSIS — F419 Anxiety disorder, unspecified: Secondary | ICD-10-CM | POA: Diagnosis present

## 2023-04-02 LAB — T3, FREE: T3, Free: 3.4 pg/mL (ref 2.0–4.4)

## 2023-04-02 MED ORDER — OLANZAPINE 10 MG IM SOLR: 10.0000 mg | Freq: Three times a day (TID) | INTRAMUSCULAR | Status: DC | PRN

## 2023-04-02 MED ORDER — ACETAMINOPHEN 325 MG PO TABS: 650.0000 mg | ORAL_TABLET | Freq: Four times a day (QID) | ORAL | Status: DC | PRN

## 2023-04-02 MED ORDER — QUETIAPINE FUMARATE 50 MG PO TABS
50.0000 mg | ORAL_TABLET | Freq: Every day | ORAL | Status: DC
  Administered 2023-04-02 – 2023-04-06 (×5): 50 mg via ORAL
  Filled 2023-04-02 (×9): qty 1

## 2023-04-02 MED ORDER — TRAZODONE HCL 50 MG PO TABS: 50.0000 mg | ORAL_TABLET | Freq: Every evening | ORAL | Status: DC | PRN

## 2023-04-02 MED ORDER — OLANZAPINE 5 MG PO TBDP: 5.0000 mg | ORAL_TABLET | Freq: Three times a day (TID) | ORAL | Status: DC | PRN

## 2023-04-02 MED ORDER — ACETAMINOPHEN 325 MG PO TABS
650.0000 mg | ORAL_TABLET | Freq: Four times a day (QID) | ORAL | Status: DC | PRN
  Administered 2023-04-02 – 2023-04-06 (×4): 650 mg via ORAL
  Filled 2023-04-02 (×4): qty 2

## 2023-04-02 MED ORDER — ALUM & MAG HYDROXIDE-SIMETH 200-200-20 MG/5ML PO SUSP: 30.0000 mL | ORAL | Status: DC | PRN

## 2023-04-02 MED ORDER — MAGNESIUM HYDROXIDE 400 MG/5ML PO SUSP: 30.0000 mL | Freq: Every day | ORAL | Status: DC | PRN

## 2023-04-02 MED ORDER — OLANZAPINE 10 MG IM SOLR: 5.0000 mg | Freq: Three times a day (TID) | INTRAMUSCULAR | Status: DC | PRN

## 2023-04-02 MED ORDER — LORAZEPAM 2 MG/ML IJ SOLN: 1.0000 mg | Freq: Once | INTRAMUSCULAR | Status: DC | PRN

## 2023-04-02 MED ORDER — HYDROXYZINE HCL 25 MG PO TABS: 25.0000 mg | ORAL_TABLET | Freq: Three times a day (TID) | ORAL | Status: DC | PRN

## 2023-04-02 NOTE — Group Note (Signed)
Date:  04/02/2023 Time:  11:03 AM  Group Topic/Focus:  Goals Group:   The focus of this group is to help patients establish daily goals to achieve during treatment and discuss how the patient can incorporate goal setting into their daily lives to aide in recovery. Orientation:   The focus of this group is to educate the patient on the purpose and policies of crisis stabilization and provide a format to answer questions about their admission.  The group details unit policies and expectations of patients while admitted.    Participation Level:  Active  Participation Quality:  Appropriate  Affect:  Appropriate  Cognitive:  Appropriate  Insight: Appropriate  Engagement in Group:  Engaged  Modes of Intervention:  Discussion  Additional Comments:    Colton Blair 04/02/2023, 11:03 AM

## 2023-04-02 NOTE — Plan of Care (Signed)
  Problem: Education: Goal: Knowledge of [facility] Education information/materials will improve Outcome: Progressing Goal: Emotional status will improve Outcome: Progressing   

## 2023-04-02 NOTE — Progress Notes (Signed)
   04/02/23 1100  Psychosocial Assessment  Patient Complaints Depression;Anxiety;Sleep disturbance  Eye Contact Brief  Facial Expression Flat  Affect Flat  Speech Logical/coherent  Interaction Minimal  Motor Activity Slow  Appearance/Hygiene Body odor;Poor hygiene  Behavior Characteristics Cooperative;Appropriate to situation;Calm  Mood Depressed;Sad  Thought Process  Coherency WDL  Content WDL  Delusions None reported or observed  Perception WDL  Hallucination None reported or observed  Judgment Impaired  Confusion None  Danger to Self  Current suicidal ideation? Denies  Self-Injurious Behavior No self-injurious ideation or behavior indicators observed or expressed   Agreement Not to Harm Self Yes

## 2023-04-02 NOTE — ED Notes (Signed)
Report has been called to receiving nurse. Safe transport has been called to transport patient to [facility].

## 2023-04-02 NOTE — Group Note (Deleted)
Date:  04/02/2023 Time:  8:27 PM  Group Topic/Focus:  Wrap-Up Group:   The focus of this group is to help patients review their daily goal of treatment and discuss progress on daily workbooks.     Participation Level:  {B[facility] PARTICIPATION WUJWJ:19147}  Participation Quality:  {BHH PARTICIPATION QUALITY:22265}  Affect:  {BHH AFFECT:22266}  Cognitive:  {BHH COGNITIVE:22267}  Insight: {BHH Insight2:20797}  Engagement in Group:  {BHH ENGAGEMENT IN WGNFA:21308}  Modes of Intervention:  {BHH MODES OF INTERVENTION:22269}  Additional Comments:  ***  Colton Blair 04/02/2023, 8:27 PM

## 2023-04-02 NOTE — BHH Group Notes (Signed)
BHH Group Notes:  (Nursing/MHT/Case Management/Adjunct)  Date:  04/02/2023  Time:  10:14 PM  Type of Therapy:   Wrap-up group  Participation Level:  Did Not Attend  Participation Quality:    Affect:    Cognitive:    Insight:    Engagement in Group:    Modes of Intervention:    Summary of Progress/Problems: Pt refused to attend group.  Noah Delaine 04/02/2023, 10:14 PM
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
## 2023-04-02 NOTE — Tx Team (Signed)
Initial Treatment Plan 04/02/2023 2:28 AM Colton Blair [id number]    PATIENT STRESSORS: Financial difficulties     PATIENT STRENGTHS: Supportive family/friends    PATIENT IDENTIFIED PROBLEMS: Financial concerns  Drug use and depression                    DISCHARGE CRITERIA:  Ability to meet basic life and health needs  PRELIMINARY DISCHARGE PLAN: Outpatient therapy  PATIENT/FAMILY INVOLVEMENT: This treatment plan has been presented to and reviewed with the patient, Colton Blair, and/or family member.  The patient and family have been given the opportunity to ask questions and make suggestions.  Mathews Argyle, RN 04/02/2023, 2:28 AM

## 2023-04-02 NOTE — Progress Notes (Signed)
     04/02/2023       3:23 PM   Colton Blair   Type of Note: [facility]  Pt was given # for [facility] to call and complete over the phone screening. Pt agreeable.  Signed:  Dayona Shaheen, LCSW-A 04/02/2023  3:23 PM

## 2023-04-02 NOTE — H&P (Signed)
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 of hospital follow-up needs prior to discharge  -- Estimated LOS: 5-7 days  -- Discharge Concerns: Need to establish a safety plan; Medication compliance and effectiveness  -- Discharge Goals: Return home with outpatient referrals for mental health follow-up including medication management/psychotherapy  I certify that inpatient services furnished can reasonably be expected to improve the patient's condition.    Margaretmary Dys, MD 12/19/20243:39 PM
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
## 2023-04-02 NOTE — Progress Notes (Addendum)
Patient arrived from [facility] for SI with plan to OD on MDMA, shoot himself or stab himself. Patient confirmed that he had been experiencing increased depression over the last 1.5 months. He currently lives with his grandmother, but feels unwanted there. He is also having difficulty with his relationship with his mother, and other family members. He is currently unemployed and names finances as another stressor. Patient is logical/coherent, with flat depressive affect. He provides minimal information, with brief eye contact. He appears disheveled and malodorous. He currently denies SI/HI, AVH, and confirms depression at this time.Admission information discussed, verbalized understanding. Patient oriented to the unit, and shown to his room.

## 2023-04-02 NOTE — Progress Notes (Signed)
   04/02/23 0206  Psych Admission Type (Psych Patients Only)  Admission Status Voluntary  Psychosocial Assessment  Patient Complaints Depression;Panic attack;Loneliness;Tension;Shakiness;Nervousness;Sadness;Self-harm thoughts;Self-harm behaviors  Eye Contact Brief  Facial Expression Flat  Affect Flat  Speech Logical/coherent  Interaction Minimal  Motor Activity Slow  Appearance/Hygiene Body odor;Poor hygiene  Behavior Characteristics Cooperative;Calm;Appropriate to situation  Mood Depressed;Sad  Aggressive Behavior  Effect No apparent injury  Thought Process  Coherency WDL  Content WDL  Delusions None reported or observed  Perception WDL  Hallucination None reported or observed  Judgment Impaired  Confusion None  Danger to Self  Current suicidal ideation? Denies  Description of Suicide Plan Denies  Agreement Not to Harm Self Yes  Description of Agreement verbal  Danger to Others  Danger to Others None reported or observed

## 2023-04-03 ENCOUNTER — Encounter (HOSPITAL_COMMUNITY): Payer: Self-pay

## 2023-04-03 NOTE — Group Note (Signed)
Recreation Therapy Group Note   Group Topic:Problem Solving  Group Date: 04/03/2023 Start Time: 0935 End Time: 1000 Facilitators: Kaitlin Alcindor-McCall, LRT,CTRS Location: [address] Dayroom   Group Topic: Communication, Team Building, Problem Solving   Goal Area(s) Addresses:  Patient will effectively work with peer towards shared goal.  Patient will identify skills used to make activity successful.  Patient will identify how skills used during activity can be used to reach post d/c goals.    Intervention: STEM Activity   Group Description: Straw Bridge. In teams of 3-5, patients were given 15 plastic drinking straws and an equal length of masking tape. Using the materials provided, patients were instructed to build a free standing bridge-like structure to suspend an everyday item (ex: puzzle box) off of the floor or table surface. All materials were required to be used by the team in their design. LRT facilitated post-activity discussion reviewing team process. Patients were encouraged to reflect how the skills used in this activity can be generalized to daily life post discharge.    Education: Pharmacist, community, Scientist, physiological, Discharge Planning    Education Outcome: Acknowledges education/In group clarification offered/Needs additional education.    Affect/Mood: N/A   Participation Level: Did not attend    Clinical Observations/Individualized Feedback:     Plan: Continue to engage patient in RT group sessions 2-3x/week.   Marjtette Nadeen Shipman-McCall, LRT,CTRS 04/03/2023 12:32 PM

## 2023-04-03 NOTE — Plan of Care (Signed)
  Problem: Safety: Goal: Periods of time without injury will increase Outcome: Progressing   Problem: Health Behavior/Discharge Planning: Goal: Compliance with therapeutic regimen will improve Outcome: Progressing   

## 2023-04-03 NOTE — Plan of Care (Signed)
  Problem: Education: Goal: Knowledge of [place] Education information/materials will improve Outcome: Progressing Goal: Mental status will improve Outcome: Progressing   Problem: Activity: Goal: Interest or engagement in activities will improve Outcome: Progressing Goal: Sleeping patterns will improve Outcome: Progressing   Problem: Coping: Goal: Ability to verbalize frustrations and anger appropriately will improve Outcome: Progressing

## 2023-04-03 NOTE — Group Note (Signed)
Date:  04/03/2023 Time:  9:09 PM  Group Topic/Focus:  Wrap-Up Group:   The focus of this group is to help patients review their daily goal of treatment and discuss progress on daily workbooks.    Participation Level:  None  Participation Quality:  Redirectable  Affect:  Appropriate  Cognitive:  Appropriate  Insight: Improving  Engagement in Group:  None  Modes of Intervention:  Discussion  Additional Comments:  Pt attended the evening wrap-up group. Tech introduced the staff for the evening, reminded group of the evening schedule and reminded them to ask for anything they need. PT and group discussed the passage "Autobiography in Five Chapters".  Osa Craver 04/03/2023, 9:09 PM

## 2023-04-03 NOTE — Progress Notes (Addendum)
     04/03/2023       11:13 AM   Dondra Prader   Type of Note: [facility]  Pt has not called [facility] yet, this writer advised pt to call today before lunch to complete intake assessment. Pt agreeable. CSW will continue to assist as needed.  3:40PM: Pt on phone now calling [facility]  Signed:  Rashad Obeid, LCSW-A 04/03/2023  11:13 AM

## 2023-04-03 NOTE — Progress Notes (Signed)
   04/03/23 1100  Psych Admission Type (Psych Patients Only)  Admission Status Voluntary  Psychosocial Assessment  Patient Complaints None  Eye Contact Brief  Facial Expression Flat  Affect Flat  Speech Logical/coherent  Interaction Minimal  Motor Activity Slow  Appearance/Hygiene Disheveled;Poor hygiene  Behavior Characteristics Cooperative  Mood Pleasant  Thought Process  Coherency WDL  Content WDL  Delusions None reported or observed  Perception WDL  Hallucination None reported or observed  Judgment Impaired  Confusion None  Danger to Self  Current suicidal ideation? Denies  Agreement Not to Harm Self Yes  Description of Agreement verbal  Danger to Others  Danger to Others None reported or observed
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
## 2023-04-03 NOTE — Progress Notes (Signed)
   04/03/23 0559  15 Minute Checks  Location Bedroom  Visual Appearance Calm  Behavior Sleeping  Sleep (Behavioral Health Patients Only)  Calculate sleep? (Click Yes once per 24 hr at 0600 safety check) Yes  Documented sleep last 24 hours 9.25

## 2023-04-03 NOTE — Group Note (Signed)
Date:  04/03/2023 Time:  9:43 AM  Group Topic/Focus:  Goals Group:   The focus of this group is to help patients establish daily goals to achieve during treatment and discuss how the patient can incorporate goal setting into their daily lives to aide in recovery. Orientation:   The focus of this group is to educate the patient on the purpose and policies of crisis stabilization and provide a format to answer questions about their admission.  The group details unit policies and expectations of patients while admitted.    Participation Level:  Active  Participation Quality:  Appropriate  Affect:  Appropriate  Cognitive:  Appropriate  Insight: Appropriate  Engagement in Group:  Engaged  Modes of Intervention:  Discussion  Additional Comments:  Pt wants to have more self control and focus  Jaquin Zamir 04/03/2023, 9:43 AM

## 2023-04-03 NOTE — BH IP Treatment Plan (Signed)
Interdisciplinary Treatment and Diagnostic Plan Update  04/03/2023 Time of Session: 11:10 AM Colton Blair MRN: [id number]  Principal Diagnosis: Post traumatic stress disorder (PTSD)  Secondary Diagnoses: Principal Problem:   Post traumatic stress disorder (PTSD) Active Problems:   Ecstasy use disorder, severe, dependence (HCC)   Current Medications:  Current Facility-Administered Medications  Medication Dose Route Frequency Provider Last Rate Last Admin   acetaminophen (TYLENOL) tablet 650 mg  650 mg Oral Q6H PRN Sindy Guadeloupe, NP   650 mg at 04/02/23 2116   alum & mag hydroxide-simeth (MAALOX/MYLANTA) 200-200-20 MG/5ML suspension 30 mL  30 mL Oral Q4H PRN Onuoha, Chinwendu V, NP       hydrOXYzine (ATARAX) tablet 25 mg  25 mg Oral TID PRN Massengill, Harrold Donath, MD       magnesium hydroxide (MILK OF MAGNESIA) suspension 30 mL  30 mL Oral Daily PRN Onuoha, Chinwendu V, NP       OLANZapine (ZYPREXA) injection 10 mg  10 mg Intramuscular TID PRN Sindy Guadeloupe, NP       OLANZapine (ZYPREXA) injection 5 mg  5 mg Intramuscular TID PRN Sindy Guadeloupe, NP       OLANZapine zydis (ZYPREXA) disintegrating tablet 5 mg  5 mg Oral TID PRN Sindy Guadeloupe, NP       QUEtiapine (SEROQUEL) tablet 50 mg  50 mg Oral QHS Margaretmary Dys, MD   50 mg at 04/02/23 2113   PTA Medications: Medications Prior to Admission  Medication Sig Dispense Refill Last Dose/Taking   prazosin (MINIPRESS) 1 MG capsule Take 1 capsule (1 mg total) by mouth at bedtime. (Patient not taking: Reported on 04/01/2023) 30 capsule 3    QUEtiapine (SEROQUEL) 50 MG tablet Take 1 tablet (50 mg total) by mouth at bedtime. (Patient not taking: Reported on 04/01/2023) 30 tablet 3     Patient Stressors: Financial difficulties    Patient Strengths: Supportive family/friends   Treatment Modalities: Medication Management, Group therapy, Case management,  1 to 1 session with clinician, Psychoeducation, Recreational  therapy.   Physician Treatment Plan for Primary Diagnosis: Post traumatic stress disorder (PTSD) Long Term Goal(s): Improvement in symptoms so as ready for discharge   Short Term Goals: Ability to identify changes in lifestyle to reduce recurrence of condition will improve Ability to verbalize feelings will improve Ability to disclose and discuss suicidal ideas Ability to demonstrate self-control will improve Ability to identify and develop effective coping behaviors will improve  Medication Management: Evaluate patient's response, side effects, and tolerance of medication regimen.  Therapeutic Interventions: 1 to 1 sessions, Unit Group sessions and Medication administration.  Evaluation of Outcomes: Not Progressing  Physician Treatment Plan for Secondary Diagnosis: Principal Problem:   Post traumatic stress disorder (PTSD) Active Problems:   Ecstasy use disorder, severe, dependence (HCC)  Long Term Goal(s): Improvement in symptoms so as ready for discharge   Short Term Goals: Ability to identify changes in lifestyle to reduce recurrence of condition will improve Ability to verbalize feelings will improve Ability to disclose and discuss suicidal ideas Ability to demonstrate self-control will improve Ability to identify and develop effective coping behaviors will improve     Medication Management: Evaluate patient's response, side effects, and tolerance of medication regimen.  Therapeutic Interventions: 1 to 1 sessions, Unit Group sessions and Medication administration.  Evaluation of Outcomes: Not Progressing   RN Treatment Plan for Primary Diagnosis: Post traumatic stress disorder (PTSD) Long Term Goal(s): Knowledge of disease and therapeutic regimen to maintain health will improve  Short Term Goals: Ability to remain free from injury will improve, Ability to verbalize frustration and anger appropriately will improve, Ability to demonstrate self-control, Ability to  participate in decision making will improve, Ability to verbalize feelings will improve, Ability to disclose and discuss suicidal ideas, Ability to identify and develop effective coping behaviors will improve, and Compliance with prescribed medications will improve  Medication Management: RN will administer medications as ordered by provider, will assess and evaluate patient's response and provide education to patient for prescribed medication. RN will report any adverse and/or side effects to prescribing provider.  Therapeutic Interventions: 1 on 1 counseling sessions, Psychoeducation, Medication administration, Evaluate responses to treatment, Monitor vital signs and CBGs as ordered, Perform/monitor CIWA, COWS, AIMS and Fall Risk screenings as ordered, Perform wound care treatments as ordered.  Evaluation of Outcomes: Not Progressing   LCSW Treatment Plan for Primary Diagnosis: Post traumatic stress disorder (PTSD) Long Term Goal(s): Safe transition to appropriate next level of care at discharge, Engage patient in therapeutic group addressing interpersonal concerns.  Short Term Goals: Engage patient in aftercare planning with referrals and resources, Increase social support, Increase ability to appropriately verbalize feelings, Increase emotional regulation, Facilitate acceptance of mental health diagnosis and concerns, Facilitate patient progression through stages of change regarding substance use diagnoses and concerns, and Identify triggers associated with mental health/substance abuse issues  Therapeutic Interventions: Assess for all discharge needs, 1 to 1 time with Social worker, Explore available resources and support systems, Assess for adequacy in community support network, Educate family and significant other(s) on suicide prevention, Complete Psychosocial Assessment, Interpersonal group therapy.  Evaluation of Outcomes: Not Progressing   Progress in Treatment: Attending groups: Yes.   Attending some groups Participating in groups: Yes.  When attending groups Taking medication as prescribed: not currently scheduled meds Toleration medication: not currently scheduled meds Family/Significant other contact made:  No, will contact:  Para Skeans (aunt) [phone] Patient understands diagnosis: Yes. Discussing patient identified problems/goals with staff: Yes. Medical problems stabilized or resolved: Yes. Denies suicidal/homicidal ideation: Yes. Issues/concerns per patient self-inventory: No.   New problem(s) identified: No, Describe:  none reported   New Short Term/Long Term Goal(s):   medication stabilization, elimination of SI thoughts, development of comprehensive mental wellness plan.    Patient Goals:  "I want to go to treatment and get my mind better."  Discharge Plan or Barriers: Patient recently admitted. CSW will continue to follow and assess for appropriate referrals and possible discharge planning.    Reason for Continuation of Hospitalization: Medication stabilization Suicidal ideation, PTSD  Estimated Length of Stay: 5 - 7 days  Last 3 [place] Suicide Severity Risk Score: Flowsheet Row Ad[facility] (Current) [facility] from 03/31/2023 in [facility] ED from 02/15/2023 in [facility] Department [facility]  C-SSRS RISK CATEGORY High Risk High Risk No Risk       Last [facility] 2/9 Scores:    01/20/2022    9:16 AM 01/03/2022   11:46 AM 01/02/2022   11:45 AM  Depression screen PHQ 2/9  Decreased Interest 2 0 2  Down, Depressed, Hopeless 3 2 1   PHQ - 2 Score 5 2 3   Altered sleeping 3 0 2  Tired, decreased energy 2 0 2  Change in appetite 2 0 1  Feeling bad or failure about yourself  3 0 1  Trouble concentrating 2 0 1  Moving slowly or fidgety/restless 2 0 1  Suicidal  thoughts 2 0 0  PHQ-9 Score 21 2 11   Difficult doing work/chores Very difficult  Not difficult at all Somewhat difficult    Scribe for Treatment Team: Alla Feeling, LCSW 04/03/2023 2:44 PM

## 2023-04-03 NOTE — Group Note (Signed)
Date:  04/03/2023 Time:  12:55 PM  Group Topic/Focus:  Emotional Education:   The focus of this group is to discuss what feelings/emotions are, and how they are experienced.    Participation Level:  Did Not Attend  Participation Quality:    Affect:    Cognitive:    Insight:   Engagement in Group:    Modes of Intervention:    Additional Comments:    Colton Blair 04/03/2023, 12:55 PM

## 2023-04-03 NOTE — Progress Notes (Signed)
   04/02/23 2116  Psych Admission Type (Psych Patients Only)  Admission Status Voluntary  Psychosocial Assessment  Patient Complaints Anxiety;Depression;Worrying  Eye Contact Brief  Facial Expression Flat  Affect Flat  Speech Logical/coherent  Interaction Minimal  Motor Activity Slow  Appearance/Hygiene Poor hygiene  Behavior Characteristics Cooperative  Mood Depressed;Sad  Thought Process  Coherency WDL  Content WDL  Delusions None reported or observed  Perception WDL  Hallucination None reported or observed  Judgment Impaired  Confusion None  Danger to Self  Current suicidal ideation? Denies  Agreement Not to Harm Self Yes  Description of Agreement verbal  Danger to Others  Danger to Others None reported or observed

## 2023-04-04 DIAGNOSIS — F332 Major depressive disorder, recurrent severe without psychotic features: Secondary | ICD-10-CM | POA: Diagnosis not present

## 2023-04-04 MED ORDER — LORAZEPAM 2 MG/ML IJ SOLN: 2.0000 mg | Freq: Three times a day (TID) | INTRAMUSCULAR | Status: DC | PRN

## 2023-04-04 MED ORDER — DIPHENHYDRAMINE HCL 25 MG PO CAPS: 50.0000 mg | ORAL_CAPSULE | Freq: Three times a day (TID) | ORAL | Status: DC | PRN

## 2023-04-04 MED ORDER — DIPHENHYDRAMINE HCL 50 MG/ML IJ SOLN: 50.0000 mg | Freq: Three times a day (TID) | INTRAMUSCULAR | Status: DC | PRN

## 2023-04-04 NOTE — Progress Notes (Signed)
   04/04/23 0600  15 Minute Checks  Location Bedroom  Visual Appearance Calm  Behavior Sleeping  Sleep (Behavioral Health Patients Only)  Calculate sleep? (Click Yes once per 24 hr at 0600 safety check) Yes  Documented sleep last 24 hours 10

## 2023-04-04 NOTE — [facility] Group Notes (Signed)
[facility] Group Notes:  (Nursing/MHT/Case Management/Adjunct)  Date:  04/04/2023  Time:  8:19 PM  Type of Therapy:   Wrap-up group  Participation Level:  Did Not Attend  Participation Quality:    Affect:    Cognitive:    Insight:    Engagement in Group:    Modes of Intervention:    Summary of Progress/Problems: Pt refused to attend group. Writer provided Pt with Corporate treasurer.  Noah Delaine 04/04/2023, 8:19 PM

## 2023-04-04 NOTE — [facility] Group Notes (Signed)
Pt did not attend goals group. 

## 2023-04-04 NOTE — Progress Notes (Signed)
   04/04/23 0900  Psych Admission Type (Psych Patients Only)  Admission Status Voluntary  Psychosocial Assessment  Patient Complaints None  Eye Contact Brief  Facial Expression Flat  Affect Flat  Speech Logical/coherent  Interaction Minimal  Motor Activity Slow  Appearance/Hygiene Poor hygiene;Disheveled  Behavior Characteristics Cooperative;Calm  Mood Pleasant;Depressed  Thought Process  Coherency WDL  Content WDL  Delusions None reported or observed  Perception WDL  Hallucination None reported or observed  Judgment Impaired  Confusion None  Danger to Self  Current suicidal ideation? Denies  Agreement Not to Harm Self Yes  Description of Agreement verbal  Danger to Others  Danger to Others None reported or observed

## 2023-04-04 NOTE — Progress Notes (Signed)
   04/03/23 2046  Psych Admission Type (Psych Patients Only)  Admission Status Voluntary  Psychosocial Assessment  Patient Complaints None  Eye Contact Brief  Facial Expression Flat  Affect Flat  Speech Logical/coherent  Interaction Minimal  Motor Activity Slow  Appearance/Hygiene Disheveled;Poor hygiene  Behavior Characteristics Appropriate to situation  Mood Sad;Depressed  Thought Process  Coherency WDL  Content WDL  Delusions WDL  Perception WDL  Hallucination None reported or observed  Judgment Impaired  Confusion None  Danger to Self  Current suicidal ideation? Denies  Agreement Not to Harm Self Yes  Description of Agreement verbal  Danger to Others  Danger to Others None reported or observed

## 2023-04-04 NOTE — [facility] Group Notes (Signed)
[facility] Group Notes:  (Nursing)  Date:  04/04/2023  Time:  1400  Type of Therapy:  Psychoeducational Skills  Participation Level:  Active  Participation Quality:  Appropriate and Attentive  Affect:  Appropriate  Cognitive:  Alert and Appropriate  Insight:  Appropriate  Engagement in Group:  Engaged  Modes of Intervention:  Discussion, Exploration, Rapport Building, Socialization, and Support  Summary of Progress/Problems:  Shela Nevin 04/04/2023, 4:44 PM
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
## 2023-04-04 NOTE — Plan of Care (Signed)
  Problem: Education: Goal: Emotional status will improve Outcome: Progressing Goal: Verbalization of understanding the information provided will improve Outcome: Progressing   Problem: Activity: Goal: Sleeping patterns will improve Outcome: Progressing   Problem: Health Behavior/Discharge Planning: Goal: Identification of resources available to assist in meeting health care needs will improve Outcome: Progressing

## 2023-04-05 DIAGNOSIS — F332 Major depressive disorder, recurrent severe without psychotic features: Secondary | ICD-10-CM | POA: Diagnosis not present

## 2023-04-05 NOTE — [facility] Group Notes (Signed)
[facility] Group Notes:  (Nursing/MHT/Case Management/Adjunct)  Date:  04/05/2023  Time:  9:08 PM  Type of Therapy:   Wrap-up group  Participation Level:  Active  Participation Quality:  Appropriate  Affect:  Appropriate  Cognitive:  Appropriate  Insight:  Appropriate  Engagement in Group:  Engaged  Modes of Intervention:  Education  Summary of Progress/Problems: No goal. Rated day 7/10.  Colton Blair 04/05/2023, 9:08 PM

## 2023-04-05 NOTE — Plan of Care (Signed)
   Problem: Coping: Goal: Ability to verbalize frustrations and anger appropriately will improve Outcome: Progressing   Problem: Safety: Goal: Periods of time without injury will increase Outcome: Progressing   Problem: Activity: Goal: Interest or engagement in activities will improve Outcome: Progressing

## 2023-04-05 NOTE — [facility] Group Notes (Signed)
Type of Therapy and Topic:  Group Therapy: Mind full vs Mindful  Participation Level:  Active   Description of Group:   In this group, patients shared and discussed the importance of acknowledging the elements in their lives for which they are mindful and how this can positively impact their mood.  The group discussed how bringing the positive elements of their lives to the forefront of their minds can help with recovery from any illness, physical or mental.  An exercise was done as a group in which a list was made of mindful items in order to encourage participants to consider other potential positives in their lives.  Therapeutic Goals: Patients will identify one or more item for which they are mindful in each of 6 categories:  people, experiences, things, places, skills, and other. Patients will discuss how it is possible to seek out mindful in even bad situations. Patients will explore other possible items of mindful that they could remember.   Summary of Patient Progress:  The patient shared that he is mindful for when he want to decide to do things with his friends he use mindfulness to be in is environment.  Patient's reaction to the group was late for group but was active.  Therapeutic Modalities:   Solution-Focused Therapy Activity

## 2023-04-05 NOTE — Progress Notes (Signed)
   04/05/23 0800  Psych Admission Type (Psych Patients Only)  Admission Status Voluntary  Psychosocial Assessment  Patient Complaints None  Eye Contact Fair  Facial Expression Other (Comment) (Smile while conversing with patient.)  Affect Flat  Speech Logical/coherent  Interaction Minimal  Motor Activity Slow  Appearance/Hygiene Disheveled;Poor hygiene  Behavior Characteristics Appropriate to situation;Cooperative  Mood Depressed;Pleasant  Thought Process  Coherency WDL  Content WDL  Delusions None reported or observed  Perception WDL  Hallucination None reported or observed  Judgment Impaired  Confusion None  Danger to Self  Current suicidal ideation? Denies  Agreement Not to Harm Self Yes  Description of Agreement Verbal  Danger to Others  Danger to Others None reported or observed

## 2023-04-05 NOTE — Progress Notes (Signed)
   04/05/23 0554  15 Minute Checks  Location Bedroom  Visual Appearance Calm  Behavior Sleeping  Sleep (Behavioral Health Patients Only)  Calculate sleep? (Click Yes once per 24 hr at 0600 safety check) Yes  Documented sleep last 24 hours 9.5

## 2023-04-05 NOTE — Progress Notes (Signed)
   04/04/23 2142  Psych Admission Type (Psych Patients Only)  Admission Status Voluntary  Psychosocial Assessment  Patient Complaints None  Eye Contact Brief  Facial Expression Flat  Affect Flat  Speech Logical/coherent  Interaction Minimal  Motor Activity Slow  Appearance/Hygiene Disheveled;Poor hygiene  Behavior Characteristics Cooperative  Mood Depressed;Pleasant  Thought Process  Coherency WDL  Content WDL  Delusions None reported or observed  Perception WDL  Hallucination None reported or observed  Judgment Impaired  Confusion None  Danger to Self  Current suicidal ideation? Denies  Agreement Not to Harm Self Yes  Description of Agreement verbal  Danger to Others  Danger to Others None reported or observed
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
## 2023-04-05 NOTE — Plan of Care (Signed)
  Problem: Safety: Goal: Periods of time without injury will increase Outcome: Progressing   

## 2023-04-06 DIAGNOSIS — F4310 Post-traumatic stress disorder, unspecified: Secondary | ICD-10-CM | POA: Diagnosis not present

## 2023-04-06 NOTE — Progress Notes (Signed)
   04/05/23 2137  Psych Admission Type (Psych Patients Only)  Admission Status Voluntary  Psychosocial Assessment  Patient Complaints None  Eye Contact Fair  Facial Expression Flat  Affect Flat  Speech Logical/coherent  Interaction Assertive  Motor Activity Slow  Appearance/Hygiene Disheveled;Poor hygiene  Behavior Characteristics Cooperative  Mood Depressed;Pleasant  Thought Process  Coherency WDL  Content WDL  Delusions None reported or observed  Perception WDL  Hallucination None reported or observed  Judgment Impaired  Confusion None  Danger to Self  Current suicidal ideation? Denies  Agreement Not to Harm Self Yes  Description of Agreement verbal  Danger to Others  Danger to Others None reported or observed

## 2023-04-06 NOTE — Progress Notes (Signed)
     04/06/2023       2:33 PM   Dondra Prader   Type of Note: Discharge Planning  Still no update regarding [facility] placement. Pt was given [employer]. Pt inquired about returning to [facility] to complete their program. This Clinical research associate called and LVM to facility awaiting response. Will continue to update.  Signed:  Shelitha Magley, LCSW-A 04/06/2023  2:33 PM

## 2023-04-06 NOTE — [facility] Group Notes (Signed)
[facility] Group Notes:  (Nursing/MHT/Case Management/Adjunct)  Date:  04/06/2023  Time:  9:52 AM  Type of Therapy:  Orientation and Goal group   Participation Level:  Did Not Attend  Participation Quality:   did not attend  Affect:   did not attend  Cognitive:   did not attend  Insight:  None  Engagement in Group:   did not attend  Modes of Intervention:   did not attend  Summary of Progress/Problems:  Adelina Mings 04/06/2023, 9:52 AM

## 2023-04-06 NOTE — Plan of Care (Signed)
  Problem: Activity: Goal: Interest or engagement in activities will improve Outcome: Progressing   Problem: Safety: Goal: Periods of time without injury will increase Outcome: Progressing   

## 2023-04-06 NOTE — Group Note (Signed)
Recreation Therapy Group Note   Group Topic:Problem Solving  Group Date: 04/06/2023 Start Time: 0932 End Time: 0956 Facilitators: Cierah Crader-McCall, LRT,CTRS Location: [address] Dayroom   Group Topic: Communication, Team Building, Problem Solving  Goal Area(s) Addresses:  Patient will effectively work with peer towards shared goal.  Patient will identify skills used to make activity successful.  Patient will identify how skills used during activity can be applied to reach post d/c goals.   Intervention: STEM Activity- Glass blower/designer  Group Description: Tallest Pharmacist, community. In teams of 5-6, patients were given 11 craft pipe cleaners. Using the materials provided, patients were instructed to compete again the opposing team(s) to build the tallest free-standing structure from floor level. The activity was timed; difficulty increased by Clinical research associate as Production designer, theatre/television/film continued.  Systematically resources were removed with additional directions for example, placing one arm behind their back, working in silence, and shape stipulations. LRT facilitated post-activity discussion reviewing team processes and necessary communication skills involved in completion. Patients were encouraged to reflect how the skills utilized, or not utilized, in this activity can be incorporated to positively impact support systems post discharge.  Education: Pharmacist, community, Scientist, physiological, Discharge Planning   Education Outcome: Acknowledges education/In group clarification offered/Needs additional education.    Affect/Mood: N/A   Participation Level: Did not attend    Clinical Observations/Individualized Feedback:      Plan: Continue to engage patient in RT group sessions 2-3x/week.   Colton Blair, LRT,CTRS 04/06/2023 12:09 PM

## 2023-04-06 NOTE — Group Note (Signed)
Date:  04/06/2023 Time:  11:03 PM  Group Topic/Focus:  Wrap-Up Group:   The focus of this group is to help patients review their daily goal of treatment and discuss progress on daily workbooks.    Participation Level:  None  Participation Quality:  Appropriate  Affect:  Appropriate  Cognitive:  Alert  Insight: None  Engagement in Group:  None  Modes of Intervention:  Discussion, Education, and Support  Additional Comments:  Pt attended wrap up group this evening, but opted out of participating.   Chrisandra Netters 04/06/2023, 11:03 PM

## 2023-04-06 NOTE — Progress Notes (Signed)
   04/06/23 0800  Psych Admission Type (Psych Patients Only)  Admission Status Voluntary  Psychosocial Assessment  Patient Complaints None  Eye Contact Fair  Facial Expression Flat  Affect Flat  Speech Logical/coherent  Interaction Assertive  Motor Activity Slow  Appearance/Hygiene Improved  Behavior Characteristics Cooperative;Appropriate to situation  Mood Depressed;Pleasant  Thought Process  Coherency WDL  Content WDL  Delusions None reported or observed  Perception WDL  Hallucination None reported or observed  Judgment Impaired  Confusion None  Danger to Self  Current suicidal ideation? Denies  Agreement Not to Harm Self Yes  Description of Agreement Verbal  Danger to Others  Danger to Others None reported or observed

## 2023-04-06 NOTE — Progress Notes (Signed)
   04/06/23 0559  15 Minute Checks  Location Bedroom  Visual Appearance Calm  Behavior Sleeping  Sleep (Behavioral Health Patients Only)  Calculate sleep? (Click Yes once per 24 hr at 0600 safety check) Yes  Documented sleep last 24 hours 8.5
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
## 2023-04-06 NOTE — Progress Notes (Signed)
     04/06/2023       10:13 AM   Dondra Prader   Type of Note: [facility] Update  Called and spoke to [facility] with patient at bedside. Facility reports that clinicals are needed. Notes have been faxed over to [email or web address]. [facility] reports that patient would need a heart clearance before being admitted. Pt does not have to go door-to-door, if discharged from [facility] pt can follow up with [facility] and still get treatment if accepted.  Pt expected to discharge tomorrow, CSW will provide other treatment options such as [facility] and [facility].   Signed:  Fabianna Keats, LCSW-A 04/06/2023  10:13 AM

## 2023-04-06 NOTE — [facility] Group Notes (Signed)

## 2023-04-06 NOTE — [facility] Suicide Risk Assessment (Signed)
[facility] INPATIENT:  Family/Significant Other Suicide Prevention Education  Suicide Prevention Education:  Education Completed; Colton Blair (aunt) [phone],  (name of family member/significant other) has been identified by the patient as the family member/significant other with whom the patient will be residing, and identified as the person(s) who will aid the patient in the event of a mental health crisis (suicidal ideations/suicide attempt).  With written consent from the patient, the family member/significant other has been provided the following suicide prevention education, prior to the and/or following the discharge of the patient.  Pt's aunt reports that to her knowledge, he does not have access to weapons or firearms.   The suicide prevention education provided includes the following: Suicide risk factors Suicide prevention and interventions National Suicide Hotline telephone number [facility] assessment telephone number [facility] Emergency Assistance 911 [facility] and/or Residential Mobile Crisis Unit telephone number  Request made of family/significant other to: Remove weapons (e.g., guns, rifles, knives), all items previously/currently identified as safety concern.   Remove drugs/medications (over-the-counter, prescriptions, illicit drugs), all items previously/currently identified as a safety concern.  The family member/significant other verbalizes understanding of the suicide prevention education information provided.  The family member/significant other agrees to remove the items of safety concern listed above.  Colton Blair 04/06/2023, 3:23 PM

## 2023-04-06 NOTE — Group Note (Signed)

## 2023-04-06 NOTE — Plan of Care (Signed)
  Problem: Activity: Goal: Interest or engagement in activities will improve Outcome: Progressing   Problem: Coping: Goal: Ability to verbalize frustrations and anger appropriately will improve Outcome: Progressing   Problem: Safety: Goal: Periods of time without injury will increase Outcome: Progressing   

## 2023-04-06 NOTE — Progress Notes (Signed)
   04/06/23 2138  Psych Admission Type (Psych Patients Only)  Admission Status Voluntary  Psychosocial Assessment  Patient Complaints None  Eye Contact Fair  Facial Expression Flat  Affect Flat  Speech Logical/coherent  Interaction Assertive  Motor Activity Slow  Appearance/Hygiene Unremarkable  Behavior Characteristics Cooperative  Mood Depressed;Pleasant  Thought Process  Coherency WDL  Content WDL  Delusions None reported or observed  Perception WDL  Hallucination None reported or observed  Judgment Impaired  Confusion None  Danger to Self  Current suicidal ideation? Denies  Agreement Not to Harm Self Yes  Description of Agreement verbal  Danger to Others  Danger to Others None reported or observed

## 2023-04-07 DIAGNOSIS — F4310 Post-traumatic stress disorder, unspecified: Secondary | ICD-10-CM

## 2023-04-07 MED ORDER — QUETIAPINE FUMARATE 50 MG PO TABS: 50.0000 mg | ORAL_TABLET | Freq: Every day | ORAL | 0 refills | Status: DC
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
## 2023-04-07 NOTE — Progress Notes (Signed)
     04/07/2023       8:29 AM   Dondra Prader   Type of Note: Discharge planning  Spoke with patient this morning to follow up on [facility]. Pt reports that he has not called any of them. This Clinical research associate has called and left 2 messages for [facility] house regarding pt going there to finish the program that he started last year. Have not gotten a call back.   Pt reports he will discharge back to [facility] and "will figure it out" when he leaves here.   Signed:  Lacora Folmer, LCSW-A 04/07/2023  8:29 AM

## 2023-04-07 NOTE — Transportation (Signed)
04/07/2023  Dondra Prader DOB: [birth year] MRN: [id number]   RIDER WAIVER AND RELEASE OF LIABILITY  For the purposes of helping with transportation needs, [facility] partners with outside transportation providers (taxi companies, [place], Catering manager.) to give [employer] patients or other approved people the choice of on-demand rides [employer]") to our buildings for non-emergency visits.  By using [employer], I, the person signing this document, on behalf of myself and/or any legal minors (in my care using the [employer]), agree:  Science writer given to me are supplied by independent, outside transportation providers who do not work for, or have any affiliation with, [employer]. [place] is not a transportation company. [place] has no control over the quality or safety of the rides I get using [employer]. [place] has no control over whether any outside ride will happen on time or not. [place] gives no guarantee on the reliability, quality, safety, or availability on any rides, or that no mistakes will happen. I know and accept that traveling by vehicle (car, truck, SVU, Zenaida Niece, bus, taxi, etc.) has risks of serious injuries such as disability, being paralyzed, and death. I know and agree the risk of using [employer] is mine alone, and not [employer]. Transport Services are provided "as is" and as are available. The transportation providers are in charge for all inspections and care of the vehicles used to provide these rides. I agree not to take legal action against [place], its agents, employees, officers, directors, representatives, insurers, attorneys, assigns, successors, subsidiaries, and affiliates at any time for any reasons related directly or indirectly to using [employer]. I also agree not to take legal action against [place] or its affiliates for any injury, death, or damage to property caused by or related to using  [employer]. I have read this Waiver and Release of Liability, and I understand the terms used in it and their legal meaning. This Waiver is freely and voluntarily given with the understanding that my right (or any legal minors) to legal action against [place] relating to [employer] is knowingly given up to use these services.   I attest that I read the Ride Waiver and Release of Liability to Dondra Prader, gave Mr. Hedglin the opportunity to ask questions and answered the questions asked (if any). I affirm that Dondra Prader then provided consent for assistance with transportation.

## 2023-04-07 NOTE — Progress Notes (Signed)
   04/07/23 0600  15 Minute Checks  Location Bedroom  Visual Appearance Calm  Behavior Composed  Sleep (Behavioral Health Patients Only)  Calculate sleep? (Click Yes once per 24 hr at 0600 safety check) Yes  Documented sleep last 24 hours 1.25

## 2023-04-07 NOTE — Progress Notes (Signed)
  [facility] Adult Case Management Discharge Plan :  Will you be returning to the same living situation after discharge:  No. [facility] At discharge, do you have transportation home?: Yes,  CSW arranged [employer] for 4:00PM when pts friend gets off work Do you have the ability to pay for your medications: No.  Release of information consent forms completed and in the chart;  Patient's signature needed at discharge.  Patient to Follow up at:  Follow-up Information     [facility]. Go on 04/14/2023.   Specialty: Behavioral Health Why: Please go to this provider on 04/14/23 at 7:00 am for medication management  and therapy services. You may also go on Monday through Friday, arrive by 7:00 am for same day service. Contact information: [address] [place] [address] [phone]                Next level of care provider has access to [facility] Link:no  Safety Planning and Suicide Prevention discussed: Yes,  Para Skeans (aunt) [phone]     Has patient been referred to the Quitline?: Patient refused referral for treatment  Patient has been referred for addiction treatment: Pt was initially interested in treatment however was denied from [facility] which was the program pt wanted to go to. Pt also was given information to follow up with [employer] and [employer]. Pt can contact these on outpatient basis if interested.   Kathi Der, LCSWA 04/07/2023, 10:37 AM

## 2023-04-07 NOTE — Discharge Instructions (Signed)
Jakobe,  It has been a pleasure to be your doctor here. I know that you are dealing with many different life stressors, but I hope you will try to engage in therapy to work on your PTSD. Remember that we can only control some of the symptoms of PTSD with medications. The real work and the real healing happens when you figure out how to deal with the trauma.   Remember to follow up with the cardiology team, see if they can get you a clearance note so that you have more options for treatment programs like [address] or [facility].   Learning about therapy: The term "therapy" can mean many things. There are many different types of psychotherapy, some are short-term, others take a longer period of time. A good therapist will meet with you, discuss your goals, and help you set a treatment plan that addresses your major concerns, regardless of what type of therapy they practice. If the first therapist is not a great fit, try another one.   The American Psychological Association (the APA) has resources to learn about different kinds of therapy.  Here is a link to learn more about the different kinds of psychotherapy:     Outpatient Therapy and Psychiatry Resources for Patients: Here are a series of links for finding a therapist.    Includes links to the following: [facility] (http://wilson-mayo.com/) (only for [facility]) [facility] (http://blankenship-martinez.net/) Psychology Today Therapist Finder (https://www.psychologytoday.com/us/therapists) Psychology Today Support Group Finder (https://www.psychologytoday.com/us/groups/) [facility] Location (https://carolinabehavioralcare.com/staff-location/[place]/) [facility] [place] - Support Group Finder -  (RecordDebt.fi) [facility] [employer] - [facility]ic[facility] (https://fspcares.org/contact/) [employer] for Mental Health [place] - [facility] (https://namiguilford.org/support-and-education/support-groups/) Interior and spatial designer Health - Aff[facility] with [facility] (https://www.Rockfish.com/lb/locations/profile/[facility]-at-[address]-[address]/) Dept of Health and Human Services - Find a mental health facility (http://lester.info/)  If you want to seriously work on the issues associated with substance abuse, here are some local resources: Local Inpatient Options for Substance Abuse Treatment  Includes links to the following: Short-Term: [facility] (TechCelebrity.com.pt) [facility] (https://ringercenters.com/) Behavioral Health Urgent Care Center's Facility Based Crisis Center  Medium Term: [facility] (Medicaid or Uninsured only) (https://www.[facility].org/locations/[facility])  Longer Residential Options: [facility], children's (MediaSuits.se) [facility] ([facility]) - Multiyear Residential Treatment (https://baker.biz/)

## 2023-04-07 NOTE — Group Note (Signed)
LCSW Group Therapy Note   Group Date: 04/07/2023 Start Time: 1330 End Time: 1430   Topic: Group Therapy: Goal Exploration   Due to the acuity and complex discharge plans, group was not held. Patient was provided therapeutic worksheets and asked to meet with CSW as needed.  Kathi Der, LCSWA 04/07/2023  2:43 PM
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
## 2023-04-07 NOTE — Group Note (Signed)
Recreation Therapy Group Note   Group Topic:Animal Assisted Therapy   Group Date: 04/07/2023 Start Time: 0950 End Time: 1030 Facilitators: Venise Ellingwood-McCall, LRT,CTRS Location: [address] Day[address]   Animal-Assisted Activity (AAA) Program Checklist/Progress Notes Patient Eligibility Criteria Checklist & Daily Group note for Rec Tx Intervention  AAA/T Program Assumption of Risk Form signed by Patient/ or Parent Legal Guardian Yes  Patient is free of allergies or severe asthma Yes  Patient reports no fear of animals Yes  Patient reports no history of cruelty to animals Yes  Patient understands his/her participation is voluntary Yes  Patient washes hands before animal contact Yes  Patient washes hands after animal contact Yes  Education: Hand Washing, Appropriate Animal Interaction   Education Outcome: Acknowledges education.    Affect/Mood: Appropriate   Participation Level: Active   Participation Quality: Independent   Behavior: Appropriate   Speech/Thought Process: Focused   Insight: Good   Judgement: Good   Modes of Intervention: Teaching laboratory technician   Patient Response to Interventions:  Receptive   Education Outcome:  In group clarification offered    Clinical Observations/Individualized Feedback: Pt came into group late. Pt engaged with peers. Pt had a good sense of humor. Pt also interacted with therapy dog team.      Plan: Continue to engage patient in RT group sessions 2-3x/week.   Shirl Ludington-McCall, LRT,CTRS 04/07/2023 1:38 PM

## 2023-04-07 NOTE — Plan of Care (Signed)
  Problem: Education: Goal: Knowledge of [facility] Education information/materials will improve Outcome: Adequate for Discharge Goal: Emotional status will improve 04/07/2023 0952 by Gardiner Barefoot, RN Outcome: Adequate for Discharge 04/07/2023 0951 by Gardiner Barefoot, RN Outcome: Progressing Goal: Mental status will improve 04/07/2023 0952 by Gardiner Barefoot, RN Outcome: Adequate for Discharge 04/07/2023 0951 by Gardiner Barefoot, RN Outcome: Progressing Goal: Verbalization of understanding the information provided will improve 04/07/2023 0952 by Gardiner Barefoot, RN Outcome: Adequate for Discharge 04/07/2023 0951 by Gardiner Barefoot, RN Outcome: Progressing   Problem: Activity: Goal: Interest or engagement in activities will improve Outcome: Adequate for Discharge Goal: Sleeping patterns will improve Outcome: Adequate for Discharge   Problem: Coping: Goal: Ability to verbalize frustrations and anger appropriately will improve Outcome: Adequate for Discharge Goal: Ability to demonstrate self-control will improve Outcome: Adequate for Discharge   Problem: Health Behavior/Discharge Planning: Goal: Identification of resources available to assist in meeting health care needs will improve Outcome: Adequate for Discharge Goal: Compliance with treatment plan for underlying cause of condition will improve Outcome: Adequate for Discharge   Problem: Physical Regulation: Goal: Ability to maintain clinical measurements within normal limits will improve Outcome: Adequate for Discharge   Problem: Safety: Goal: Periods of time without injury will increase Outcome: Adequate for Discharge   Problem: Education: Goal: Knowledge of [facility] Education information/materials will improve Outcome: Adequate for Discharge Goal: Emotional status will improve Outcome: Adequate for Discharge Goal: Mental status will improve Outcome: Adequate for  Discharge Goal: Verbalization of understanding the information provided will improve Outcome: Adequate for Discharge   Problem: Activity: Goal: Interest or engagement in activities will improve Outcome: Adequate for Discharge Goal: Sleeping patterns will improve Outcome: Adequate for Discharge   Problem: Coping: Goal: Ability to verbalize frustrations and anger appropriately will improve Outcome: Adequate for Discharge Goal: Ability to demonstrate self-control will improve Outcome: Adequate for Discharge   Problem: Health Behavior/Discharge Planning: Goal: Identification of resources available to assist in meeting health care needs will improve Outcome: Adequate for Discharge Goal: Compliance with treatment plan for underlying cause of condition will improve Outcome: Adequate for Discharge   Problem: Physical Regulation: Goal: Ability to maintain clinical measurements within normal limits will improve Outcome: Adequate for Discharge   Problem: Safety: Goal: Periods of time without injury will increase Outcome: Adequate for Discharge   Problem: Education: Goal: Utilization of techniques to improve thought processes will improve Outcome: Adequate for Discharge Goal: Knowledge of the prescribed therapeutic regimen will improve Outcome: Adequate for Discharge   Problem: Activity: Goal: Interest or engagement in leisure activities will improve Outcome: Adequate for Discharge Goal: Imbalance in normal sleep/wake cycle will improve Outcome: Adequate for Discharge   Problem: Coping: Goal: Coping ability will improve Outcome: Adequate for Discharge Goal: Will verbalize feelings Outcome: Adequate for Discharge   Problem: Health Behavior/Discharge Planning: Goal: Ability to make decisions will improve Outcome: Adequate for Discharge Goal: Compliance with therapeutic regimen will improve Outcome: Adequate for Discharge   Problem: Role Relationship: Goal: Will demonstrate  positive changes in social behaviors and relationships Outcome: Adequate for Discharge   Problem: Safety: Goal: Ability to disclose and discuss suicidal ideas will improve Outcome: Adequate for Discharge Goal: Ability to identify and utilize support systems that promote safety will improve Outcome: Adequate for Discharge   Problem: Self-Concept: Goal: Will verbalize positive feelings about self Outcome: Adequate for Discharge Goal: Level of anxiety will decrease Outcome: Adequate for Discharge

## 2023-06-02 DIAGNOSIS — F1010 Alcohol abuse, uncomplicated: Secondary | ICD-10-CM | POA: Insufficient documentation

## 2023-06-02 DIAGNOSIS — R45851 Suicidal ideations: Secondary | ICD-10-CM | POA: Insufficient documentation

## 2023-06-02 DIAGNOSIS — Z5900 Homelessness unspecified: Secondary | ICD-10-CM | POA: Insufficient documentation

## 2023-06-02 DIAGNOSIS — F32A Depression, unspecified: Secondary | ICD-10-CM | POA: Insufficient documentation

## 2023-06-02 DIAGNOSIS — F1910 Other psychoactive substance abuse, uncomplicated: Secondary | ICD-10-CM | POA: Insufficient documentation

## 2023-06-03 ENCOUNTER — Ambulatory Visit (HOSPITAL_COMMUNITY)
Admission: EM | Admit: 2023-06-03 | Discharge: 2023-06-03 | Disposition: A | Discharge status: Other Acute Inpatient Hospital | Payer: No Payment, Other | Attending: Psychiatry | Admitting: Psychiatry

## 2023-06-03 ENCOUNTER — Other Ambulatory Visit: Payer: Self-pay

## 2023-06-03 ENCOUNTER — Encounter (HOSPITAL_COMMUNITY): Payer: Self-pay | Admitting: Psychiatry

## 2023-06-03 ENCOUNTER — Encounter (HOSPITAL_COMMUNITY): Payer: Self-pay

## 2023-06-03 ENCOUNTER — Inpatient Hospital Stay (HOSPITAL_COMMUNITY)
Admission: AD | Admit: 2023-06-03 | Discharge: 2023-06-09 | DRG: 897 | Disposition: A | Discharge status: Home / Self Care | Payer: No Payment, Other | Source: Intra-hospital | Attending: Psychiatry | Admitting: Psychiatry

## 2023-06-03 DIAGNOSIS — F1910 Other psychoactive substance abuse, uncomplicated: Secondary | ICD-10-CM | POA: Diagnosis not present

## 2023-06-03 DIAGNOSIS — F332 Major depressive disorder, recurrent severe without psychotic features: Secondary | ICD-10-CM | POA: Diagnosis present

## 2023-06-03 DIAGNOSIS — F1010 Alcohol abuse, uncomplicated: Secondary | ICD-10-CM | POA: Diagnosis present

## 2023-06-03 DIAGNOSIS — F1290 Cannabis use, unspecified, uncomplicated: Secondary | ICD-10-CM | POA: Diagnosis present

## 2023-06-03 DIAGNOSIS — F4310 Post-traumatic stress disorder, unspecified: Secondary | ICD-10-CM | POA: Diagnosis present

## 2023-06-03 DIAGNOSIS — F1410 Cocaine abuse, uncomplicated: Secondary | ICD-10-CM | POA: Diagnosis present

## 2023-06-03 DIAGNOSIS — R4587 Impulsiveness: Secondary | ICD-10-CM | POA: Diagnosis present

## 2023-06-03 DIAGNOSIS — Z79899 Other long term (current) drug therapy: Secondary | ICD-10-CM

## 2023-06-03 DIAGNOSIS — Z9151 Personal history of suicidal behavior: Secondary | ICD-10-CM

## 2023-06-03 DIAGNOSIS — Z8249 Family history of ischemic heart disease and other diseases of the circulatory system: Secondary | ICD-10-CM

## 2023-06-03 DIAGNOSIS — R45851 Suicidal ideations: Secondary | ICD-10-CM | POA: Diagnosis present

## 2023-06-03 DIAGNOSIS — Z5941 Food insecurity: Secondary | ICD-10-CM | POA: Diagnosis not present

## 2023-06-03 DIAGNOSIS — F32A Depression, unspecified: Secondary | ICD-10-CM | POA: Diagnosis not present

## 2023-06-03 DIAGNOSIS — Z833 Family history of diabetes mellitus: Secondary | ICD-10-CM | POA: Diagnosis not present

## 2023-06-03 DIAGNOSIS — F1994 Other psychoactive substance use, unspecified with psychoactive substance-induced mood disorder: Principal | ICD-10-CM | POA: Diagnosis present

## 2023-06-03 DIAGNOSIS — F17210 Nicotine dependence, cigarettes, uncomplicated: Secondary | ICD-10-CM | POA: Diagnosis present

## 2023-06-03 DIAGNOSIS — F1514 Other stimulant abuse with stimulant-induced mood disorder: Principal | ICD-10-CM | POA: Diagnosis present

## 2023-06-03 DIAGNOSIS — Z560 Unemployment, unspecified: Secondary | ICD-10-CM

## 2023-06-03 LAB — COMPREHENSIVE METABOLIC PANEL
ALT: 32 U/L (ref 0–44)
AST: 24 U/L (ref 15–41)
Albumin: 4.5 g/dL (ref 3.5–5.0)
Alkaline Phosphatase: 46 U/L (ref 38–126)
Anion gap: 16 — ABNORMAL HIGH (ref 5–15)
BUN: 9 mg/dL (ref 6–20)
CO2: 24 mmol/L (ref 22–32)
Calcium: 10 mg/dL (ref 8.9–10.3)
Chloride: 102 mmol/L (ref 98–111)
Creatinine, Ser: 1.19 mg/dL (ref 0.61–1.24)
GFR, Estimated: >60 mL/min (ref >60)
Glucose, Bld: 102 mg/dL — ABNORMAL HIGH (ref 70–99)
Potassium: 4.4 mmol/L (ref 3.5–5.1)
Sodium: 142 mmol/L (ref 135–145)
Total Bilirubin: 1.2 mg/dL (ref 0.0–1.2)
Total Protein: 7 g/dL (ref 6.5–8.1)

## 2023-06-03 LAB — CBC WITH DIFFERENTIAL/PLATELET
Abs Immature Granulocytes: 0.01 10*3/uL (ref 0.00–0.07)
Basophils Absolute: 0.1 10*3/uL (ref 0.0–0.1)
Basophils Relative: 1 %
Eosinophils Absolute: 0.1 10*3/uL (ref 0.0–0.5)
Eosinophils Relative: 2 %
HCT: 47.8 % (ref 39.0–52.0)
Hemoglobin: 15.6 g/dL (ref 13.0–17.0)
Immature Granulocytes: 0 %
Lymphocytes Relative: 36 %
Lymphs Abs: 2.7 10*3/uL (ref 0.7–4.0)
MCH: 31.2 pg (ref 26.0–34.0)
MCHC: 32.6 g/dL (ref 30.0–36.0)
MCV: 95.6 fL (ref 80.0–100.0)
Monocytes Absolute: 0.7 10*3/uL (ref 0.1–1.0)
Monocytes Relative: 10 %
Neutro Abs: 3.8 10*3/uL (ref 1.7–7.7)
Neutrophils Relative %: 51 %
Platelets: 322 10*3/uL (ref 150–400)
RBC: 5 MIL/uL (ref 4.22–5.81)
RDW: 12.9 % (ref 11.5–15.5)
WBC: 7.5 10*3/uL (ref 4.0–10.5)
nRBC: 0 % (ref 0.0–0.2)

## 2023-06-03 LAB — TSH: TSH: 0.895 u[IU]/mL (ref 0.350–4.500)

## 2023-06-03 LAB — ETHANOL: Alcohol, Ethyl (B): <10 mg/dL (ref <10)

## 2023-06-03 MED ORDER — ALUM & MAG HYDROXIDE-SIMETH 200-200-20 MG/5ML PO SUSP: 30.0000 mL | ORAL | Status: DC | PRN

## 2023-06-03 MED ORDER — HYDROXYZINE HCL 25 MG PO TABS: 25.0000 mg | ORAL_TABLET | Freq: Three times a day (TID) | ORAL | Status: DC | PRN

## 2023-06-03 MED ORDER — ZIPRASIDONE MESYLATE 20 MG IM SOLR: 20.0000 mg | Freq: Two times a day (BID) | INTRAMUSCULAR | Status: DC | PRN

## 2023-06-03 MED ORDER — ADULT MULTIVITAMIN W/MINERALS CH
1.0000 | ORAL_TABLET | Freq: Every day | ORAL | Status: DC
  Administered 2023-06-04 – 2023-06-09 (×4): 1 via ORAL
  Filled 2023-06-03 (×10): qty 1

## 2023-06-03 MED ORDER — ACETAMINOPHEN 325 MG PO TABS: 650.0000 mg | ORAL_TABLET | Freq: Four times a day (QID) | ORAL | Status: DC | PRN

## 2023-06-03 MED ORDER — MAGNESIUM HYDROXIDE 400 MG/5ML PO SUSP: 30.0000 mL | Freq: Every day | ORAL | Status: DC | PRN

## 2023-06-03 MED ORDER — ALUM & MAG HYDROXIDE-SIMETH 200-200-20 MG/5ML PO SUSP: 30.0000 mL | Freq: Four times a day (QID) | ORAL | Status: DC | PRN

## 2023-06-03 MED ORDER — LOPERAMIDE HCL 2 MG PO CAPS: 2.0000 mg | ORAL_CAPSULE | ORAL | Status: AC | PRN

## 2023-06-03 MED ORDER — METHOCARBAMOL 500 MG PO TABS: 500.0000 mg | ORAL_TABLET | Freq: Three times a day (TID) | ORAL | Status: AC | PRN

## 2023-06-03 MED ORDER — VITAMIN B-1 100 MG PO TABS
100.0000 mg | ORAL_TABLET | Freq: Every day | ORAL | Status: DC
  Administered 2023-06-04 – 2023-06-09 (×5): 100 mg via ORAL
  Filled 2023-06-03 (×8): qty 1

## 2023-06-03 MED ORDER — TRAZODONE HCL 50 MG PO TABS
50.0000 mg | ORAL_TABLET | Freq: Every evening | ORAL | Status: DC | PRN
  Administered 2023-06-04: 50 mg via ORAL
  Filled 2023-06-03 (×2): qty 1

## 2023-06-03 MED ORDER — ACETAMINOPHEN 325 MG PO TABS
650.0000 mg | ORAL_TABLET | Freq: Four times a day (QID) | ORAL | Status: DC | PRN
  Administered 2023-06-04 – 2023-06-05 (×2): 650 mg via ORAL
  Filled 2023-06-03 (×2): qty 2

## 2023-06-03 MED ORDER — LORAZEPAM 1 MG PO TABS: 1.0000 mg | ORAL_TABLET | Freq: Four times a day (QID) | ORAL | Status: AC | PRN

## 2023-06-03 MED ORDER — OLANZAPINE 10 MG IM SOLR: 5.0000 mg | Freq: Three times a day (TID) | INTRAMUSCULAR | Status: DC | PRN

## 2023-06-03 MED ORDER — NAPROXEN 500 MG PO TABS: 500.0000 mg | ORAL_TABLET | Freq: Two times a day (BID) | ORAL | Status: AC | PRN

## 2023-06-03 MED ORDER — DICYCLOMINE HCL 20 MG PO TABS: 20.0000 mg | ORAL_TABLET | Freq: Four times a day (QID) | ORAL | Status: AC | PRN

## 2023-06-03 MED ORDER — OLANZAPINE 10 MG IM SOLR: 10.0000 mg | Freq: Three times a day (TID) | INTRAMUSCULAR | Status: DC | PRN

## 2023-06-03 MED ORDER — OLANZAPINE 5 MG PO TBDP: 5.0000 mg | ORAL_TABLET | Freq: Three times a day (TID) | ORAL | Status: DC | PRN

## 2023-06-03 NOTE — [facility] Group Notes (Signed)
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
## 2023-06-03 NOTE — ED Notes (Signed)
 Pt sleeping@this  time breathing even and unlabored will continue to monitor for safety

## 2023-06-03 NOTE — ED Notes (Signed)
Patient arrived to the unit with steady gait and without any distress. He is calm and cooperative with flat affect. He reports depression 7 and denies anxiety, SIHI, and AVH. He reports 1 suicide attempt in 2015 by overdose. He reports sleeping okay without having night mares. Staff will continue to monitor safety and changes in condition.

## 2023-06-03 NOTE — ED Provider Notes (Signed)
[facility] Continuous Assessment Admission H&P  Date: 06/03/23 Patient Name: Colton Blair MRN: [id number] Chief Complaint: Suicidal ideation  Diagnoses:  Final diagnoses:  Polysubstance abuse (HCC)  Suicidal thoughts  Alcohol abuse    HPI: Idelle Jo, 28 y/o male with a history of substance abuse (cocaine,, marijuana, estasy), presented to [facility] voluntarily.  Patient complained of increased depression with suicide thoughts according to patient he planned to pop some pills I review of patient's records show that patient had OD in the pass.  Patient is currently unemployed, lives with his grandmother but stating that sometimes he is homeless.  Patient stated that he was seeing a psychiatrist at [facility] and also a therapist.  According to patient he was hospitalized for 2 weeks in December.  Patient reports he is currently unemployed.  Face-to-face evaluation of patient, patient is alert and oriented to person and place, maintain eye contact.  Patient appearance is depressed, affect is flat congruent with mood.  Patient endorsed suicidal ideation with plans to pop pills.  Patient report he has been smoking marijuana and he used ecstasy pill a lot, patient could not give a specific amount but stated he used it every day.  Patient also reported consuming a lot of alcohol but could not give a quantity amount.  Patient does appear to be influenced by internal stimuli as patient could be seen whispering to himself and tilting his head as if he was looking for something.  Writer discussed with patient that we will be admitting him due to his presentation and past history.  Recommend inpatient observation.  Total Time spent with patient: 30 minutes  Musculoskeletal  Strength & Muscle Tone: within normal limits Gait & Station: normal Patient leans: N/A  Psychiatric Specialty Exam  Presentation General Appearance:  Casual  Eye Contact: Good  Speech: Clear and Coherent  Speech  Volume: Normal  Handedness: Right   Mood and Affect  Mood: Depressed; Euthymic  Affect: Congruent   Thought Process  Thought Processes: Coherent  Descriptions of Associations:Intact  Orientation:Full (Time, Place and Person)  Thought Content:WDL  Diagnosis of Schizophrenia or Schizoaffective disorder in past: No   Hallucinations:Hallucinations: None  Ideas of Reference:None  Suicidal Thoughts:Suicidal Thoughts: Yes, Passive SI Passive Intent and/or Plan: With Intent; With Plan  Homicidal Thoughts:Homicidal Thoughts: No   Sensorium  Memory: Immediate Fair  Judgment: Poor  Insight: Shallow   Executive Functions  Concentration: Fair  Attention Span: Fair  Recall: Fair  Fund of Knowledge: Fair  Language: Fair   Psychomotor Activity  Psychomotor Activity: Psychomotor Activity: Normal   Assets  Assets: Desire for Improvement; Resilience; Vocational/Educational   Sleep  Sleep: Sleep: Poor Number of Hours of Sleep: 2   Nutritional Assessment (For OBS and FBC admissions only) Has the patient had a weight loss or gain of 10 pounds or more in the last 3 months?: No Has the patient had a decrease in food intake/or appetite?: No Does the patient have dental problems?: No Does the patient have eating habits or behaviors that may be indicators of an eating disorder including binging or inducing vomiting?: No Has the patient recently lost weight without trying?: 0 Has the patient been eating poorly because of a decreased appetite?: 0 Malnutrition Screening Tool Score: 0    Physical Exam HENT:     Head: Normocephalic.     Nose: Nose normal.  Eyes:     Pupils: Pupils are equal, round, and reactive to light.  Cardiovascular:  Rate and Rhythm: Tachycardia present.  Pulmonary:     Effort: Pulmonary effort is normal.  Musculoskeletal:        General: Normal range of motion.     Cervical back: Normal range of motion.  Neurological:      General: No focal deficit present.     Mental Status: He is alert.  Psychiatric:        Mood and Affect: Mood normal.        Behavior: Behavior normal.        Thought Content: Thought content normal.        Judgment: Judgment normal.    Review of Systems  Constitutional: Negative.   HENT: Negative.    Eyes: Negative.   Respiratory: Negative.    Cardiovascular: Negative.   Gastrointestinal: Negative.   Genitourinary: Negative.   Musculoskeletal: Negative.   Skin: Negative.   Neurological: Negative.   Psychiatric/Behavioral:  Positive for depression, substance abuse and suicidal ideas. The patient is nervous/anxious.     Blood pressure 114/68, pulse (!) 109, temperature 98.9 F (37.2 C), resp. rate 18, SpO2 99%. There is no height or weight on file to calculate BMI.  Past Psychiatric History: Polysubstance abuse, alcohol abuse,OD, SI  Is the patient at risk to self? Yes  Has the patient been a risk to self in the past 6 months? Yes .    Has the patient been a risk to self within the distant past? Yes   Is the patient a risk to others? No   Has the patient been a risk to others in the past 6 months? No   Has the patient been a risk to others within the distant past? No   Past Medical History: see chart  Family History: unknown  Social History: estasy, marijuana, alcohol  Last Labs:  Admission on 06/03/2023  Component Date Value Ref Range Status   WBC 06/03/2023 7.5  4.0 - 10.5 K/uL Final   RBC 06/03/2023 5.00  4.22 - 5.81 MIL/uL Final   Hemoglobin 06/03/2023 15.6  13.0 - 17.0 g/dL Final   HCT 29/56/2130 47.8  39.0 - 52.0 % Final   MCV 06/03/2023 95.6  80.0 - 100.0 fL Final   MCH 06/03/2023 31.2  26.0 - 34.0 pg Final   MCHC 06/03/2023 32.6  30.0 - 36.0 g/dL Final   RDW 86/57/8469 12.9  11.5 - 15.5 % Final   Platelets 06/03/2023 322  150 - 400 K/uL Final   nRBC 06/03/2023 0.0  0.0 - 0.2 % Final   Neutrophils Relative % 06/03/2023 51  % Final   Neutro Abs  06/03/2023 3.8  1.7 - 7.7 K/uL Final   Lymphocytes Relative 06/03/2023 36  % Final   Lymphs Abs 06/03/2023 2.7  0.7 - 4.0 K/uL Final   Monocytes Relative 06/03/2023 10  % Final   Monocytes Absolute 06/03/2023 0.7  0.1 - 1.0 K/uL Final   Eosinophils Relative 06/03/2023 2  % Final   Eosinophils Absolute 06/03/2023 0.1  0.0 - 0.5 K/uL Final   Basophils Relative 06/03/2023 1  % Final   Basophils Absolute 06/03/2023 0.1  0.0 - 0.1 K/uL Final   Immature Granulocytes 06/03/2023 0  % Final   Abs Immature Granulocytes 06/03/2023 0.01  0.00 - 0.07 K/uL Final   Performed at [facility], [address] [place] [address], [place], [place] [address]   Sodium 06/03/2023 142  135 - 145 mmol/L Final   Potassium 06/03/2023 4.4  3.5 - 5.1 mmol/L Final  Chloride 06/03/2023 102  98 - 111 mmol/L Final   CO2 06/03/2023 24  22 - 32 mmol/L Final   Glucose, Bld 06/03/2023 102 (H)  70 - 99 mg/dL Final   Glucose reference range applies only to samples taken after fasting for at least 8 hours.   BUN 06/03/2023 9  6 - 20 mg/dL Final   Creatinine, Ser 06/03/2023 1.19  0.61 - 1.24 mg/dL Final   Calcium 19/14/7829 10.0  8.9 - 10.3 mg/dL Final   Total Protein 56/21/3086 7.0  6.5 - 8.1 g/dL Final   Albumin 57/84/6962 4.5  3.5 - 5.0 g/dL Final   AST 95/28/4132 24  15 - 41 U/L Final   ALT 06/03/2023 32  0 - 44 U/L Final   Alkaline Phosphatase 06/03/2023 46  38 - 126 U/L Final   Total Bilirubin 06/03/2023 1.2  0.0 - 1.2 mg/dL Final   GFR, Estimated 06/03/2023 >60  >60 mL/min Final   Comment: (NOTE) Calculated using the CKD-EPI Creatinine Equation (2021)    Anion gap 06/03/2023 16 (H)  5 - 15 Final   Performed at [facility], [address], [place], [place] [address]   Alcohol, Ethyl (B) 06/03/2023 <10  <10 mg/dL Final   Comment: (NOTE) Lowest detectable limit for serum alcohol is 10 mg/dL.  For medical purposes only. Performed at [facility], [address], [place], [place] [address]    TSH 06/03/2023 0.895   0.350 - 4.500 uIU/mL Final   Comment: Performed by a 3rd Generation assay with a functional sensitivity of <=0.01 uIU/mL. Performed at [facility], [address], [place], [place] [address]   Admission on 03/31/2023, Discharged on 04/02/2023  Component Date Value Ref Range Status   WBC 03/31/2023 7.4  4.0 - 10.5 K/uL Final   RBC 03/31/2023 5.13  4.22 - 5.81 MIL/uL Final   Hemoglobin 03/31/2023 16.7  13.0 - 17.0 g/dL Final   HCT 34/74/2595 48.4  39.0 - 52.0 % Final   MCV 03/31/2023 94.3  80.0 - 100.0 fL Final   MCH 03/31/2023 32.6  26.0 - 34.0 pg Final   MCHC 03/31/2023 34.5  30.0 - 36.0 g/dL Final   RDW 63/87/5643 13.5  11.5 - 15.5 % Final   Platelets 03/31/2023 383  150 - 400 K/uL Final   nRBC 03/31/2023 0.0  0.0 - 0.2 % Final   Neutrophils Relative % 03/31/2023 54  % Final   Neutro Abs 03/31/2023 4.0  1.7 - 7.7 K/uL Final   Lymphocytes Relative 03/31/2023 31  % Final   Lymphs Abs 03/31/2023 2.3  0.7 - 4.0 K/uL Final   Monocytes Relative 03/31/2023 9  % Final   Monocytes Absolute 03/31/2023 0.6  0.1 - 1.0 K/uL Final   Eosinophils Relative 03/31/2023 5  % Final   Eosinophils Absolute 03/31/2023 0.4  0.0 - 0.5 K/uL Final   Basophils Relative 03/31/2023 1  % Final   Basophils Absolute 03/31/2023 0.1  0.0 - 0.1 K/uL Final   Immature Granulocytes 03/31/2023 0  % Final   Abs Immature Granulocytes 03/31/2023 0.01  0.00 - 0.07 K/uL Final   Performed at [facility], [address], [place], [place] [address]   Sodium 03/31/2023 141  135 - 145 mmol/L Final   Potassium 03/31/2023 4.1  3.5 - 5.1 mmol/L Final   Chloride 03/31/2023 107  98 - 111 mmol/L Final   CO2 03/31/2023 25  22 - 32 mmol/L Final  Glucose, Bld 03/31/2023 180 (H)  70 - 99 mg/dL Final   Glucose reference range applies only to samples taken after fasting for at least 8 hours.   BUN 03/31/2023 10  6 - 20 mg/dL Final   Creatinine, Ser 03/31/2023 1.01  0.61 - 1.24 mg/dL Final   Calcium 16/01/9603 9.6  8.9 - 10.3  mg/dL Final   Total Protein 54/12/8117 6.4 (L)  6.5 - 8.1 g/dL Final   Albumin 14/78/2956 3.9  3.5 - 5.0 g/dL Final   AST 21/30/8657 22  15 - 41 U/L Final   ALT 03/31/2023 23  0 - 44 U/L Final   Alkaline Phosphatase 03/31/2023 50  38 - 126 U/L Final   Total Bilirubin 03/31/2023 1.2 (H)  <1.2 mg/dL Final   GFR, Estimated 03/31/2023 >60  >60 mL/min Final   Comment: (NOTE) Calculated using the CKD-EPI Creatinine Equation (2021)    Anion gap 03/31/2023 9  5 - 15 Final   Performed at [facility], [address], [place], [place] [address]   Hgb A1c MFr Bld 03/31/2023 5.7 (H)  4.8 - 5.6 % Final   Comment: (NOTE) Pre diabetes:          5.7%-6.4%  Diabetes:              >6.4%  Glycemic control for   <7.0% adults with diabetes    Mean Plasma Glucose 03/31/2023 116.89  mg/dL Final   Performed at [facility], [address] [place], [place] [address]   Cholesterol 03/31/2023 120  0 - 200 mg/dL Final   Triglycerides 41/32/4401 55  <150 mg/dL Final   HDL 02/72/5366 34 (L)  >40 mg/dL Final   Total CHOL/HDL Ratio 03/31/2023 3.5  RATIO Final   VLDL 03/31/2023 11  0 - 40 mg/dL Final   LDL Cholesterol 03/31/2023 75  0 - 99 mg/dL Final   Comment:        Total Cholesterol/HDL:CHD Risk Coronary Heart Disease Risk Table                     Men   Women  1/2 Average Risk   3.4   3.3  Average Risk       5.0   4.4  2 X Average Risk   9.6   7.1  3 X Average Risk  23.4   11.0        Use the calculated Patient Ratio above and the CHD Risk Table to determine the patient's CHD Risk.        ATP III CLASSIFICATION (LDL):  <100     mg/dL   Optimal  440-347  mg/dL   Near or Above                    Optimal  130-159  mg/dL   Borderline  425-956  mg/dL   High  >387     mg/dL   Very High Performed at [facility], [address], [place], [place] [address]    TSH 03/31/2023 0.332 (L)  0.350 - 4.500 uIU/mL Final   Comment: Performed by a 3rd Generation assay with a functional  sensitivity of <=0.01 uIU/mL. Performed at [facility], [address], [place], [place] [address]    POC Amphetamine UR 03/31/2023 None Detected  NONE DETECTED (Cut Off Level 1000 ng/mL) Final   POC Secobarbital (BAR) 03/31/2023 None Detected  NONE DETECTED (Cut Off Level 300  ng/mL) Final   POC Buprenorphine (BUP) 03/31/2023 None Detected  NONE DETECTED (Cut Off Level 10 ng/mL) Final   POC Oxazepam (BZO) 03/31/2023 None Detected  NONE DETECTED (Cut Off Level 300 ng/mL) Final   POC Cocaine UR 03/31/2023 None Detected  NONE DETECTED (Cut Off Level 300 ng/mL) Final   POC Methamphetamine UR 03/31/2023 Positive (A)  NONE DETECTED (Cut Off Level 1000 ng/mL) Final   POC Morphine 03/31/2023 None Detected  NONE DETECTED (Cut Off Level 300 ng/mL) Final   POC Methadone UR 03/31/2023 None Detected  NONE DETECTED (Cut Off Level 300 ng/mL) Final   POC Oxycodone UR 03/31/2023 None Detected  NONE DETECTED (Cut Off Level 100 ng/mL) Final   POC Marijuana UR 03/31/2023 None Detected  NONE DETECTED (Cut Off Level 50 ng/mL) Final   T3, Free 03/31/2023 3.4  2.0 - 4.4 pg/mL Final   Comment: (NOTE) Performed At: [facility] [address] [place], [place] [id number] Jolene Schimke MD WU:[id number]    Free T4 03/31/2023 0.94  0.61 - 1.12 ng/dL Final   Comment: (NOTE) Biotin ingestion may interfere with free T4 tests. If the results are inconsistent with the TSH level, previous test results, or the clinical presentation, then consider biotin interference. If needed, order repeat testing after stopping biotin. Performed at [facility], [address] [place], [place] [address]     Allergies: Patient has no allergy information on record.  Medications:  Facility Ordered Medications  Medication   acetaminophen (TYLENOL) tablet 650 mg   alum & mag hydroxide-simeth (MAALOX/MYLANTA) 200-200-20 MG/5ML suspension 30 mL   magnesium hydroxide (MILK OF MAGNESIA) suspension 30 mL   OLANZapine  zydis (ZYPREXA) disintegrating tablet 5 mg   OLANZapine (ZYPREXA) injection 5 mg   OLANZapine (ZYPREXA) injection 10 mg   PTA Medications  Medication Sig   QUEtiapine (SEROQUEL) 50 MG tablet Take 1 tablet (50 mg total) by mouth at bedtime.      Medical Decision Making  Inpatient observation   Lab Orders         SARS Coronavirus 2 by RT PCR (hospital order, performed in [facility] hospital lab) *cepheid single result test* Anterior Nasal Swab         CBC with Differential/Platelet         Comprehensive metabolic panel         Ethanol         TSH         POCT Urine Drug Screen - (I-Screen)      Meds ordered this encounter  Medications   acetaminophen (TYLENOL) tablet 650 mg   alum & mag hydroxide-simeth (MAALOX/MYLANTA) 200-200-20 MG/5ML suspension 30 mL   magnesium hydroxide (MILK OF MAGNESIA) suspension 30 mL   OLANZapine zydis (ZYPREXA) disintegrating tablet 5 mg   OLANZapine (ZYPREXA) injection 5 mg   OLANZapine (ZYPREXA) injection 10 mg     Recommendations  Based on my evaluation the patient does not appear to have an emergency medical condition.  Sindy Guadeloupe, NP 06/03/23  5:10 AM

## 2023-06-03 NOTE — BH IP Treatment Plan (Signed)
Interdisciplinary Treatment and Diagnostic Plan Update  06/03/2023 Time of Session: 1040am Colton Blair MRN: [id number]  Principal Diagnosis: Substance induced mood disorder (HCC)  Secondary Diagnoses: Principal Problem:   Substance induced mood disorder (HCC)   Current Medications:  Current Facility-Administered Medications  Medication Dose Route Frequency Provider Last Rate Last Admin   acetaminophen (TYLENOL) tablet 650 mg  650 mg Oral Q6H PRN Lauro Franklin, MD       alum & mag hydroxide-simeth (MAALOX/MYLANTA) 200-200-20 MG/5ML suspension 30 mL  30 mL Oral Q6H PRN Renaldo Fiddler, Mardelle Matte, MD       dicyclomine (BENTYL) tablet 20 mg  20 mg Oral Q6H PRN Lauro Franklin, MD       hydrOXYzine (ATARAX) tablet 25 mg  25 mg Oral TID PRN Onuoha, Chinwendu V, NP       loperamide (IMODIUM) capsule 2-4 mg  2-4 mg Oral PRN Pashayan, Mardelle Matte, MD       LORazepam (ATIVAN) tablet 1 mg  1 mg Oral Q6H PRN Lauro Franklin, MD       magnesium hydroxide (MILK OF MAGNESIA) suspension 30 mL  30 mL Oral Daily PRN Pashayan, Mardelle Matte, MD       methocarbamol (ROBAXIN) tablet 500 mg  500 mg Oral Q8H PRN Pashayan, Mardelle Matte, MD       multivitamin with minerals tablet 1 tablet  1 tablet Oral Daily Pashayan, Mardelle Matte, MD       naproxen (NAPROSYN) tablet 500 mg  500 mg Oral BID PRN Lauro Franklin, MD       [START ON 06/04/2023] thiamine (Vitamin B-1) tablet 100 mg  100 mg Oral Daily Pashayan, Mardelle Matte, MD       traZODone (DESYREL) tablet 50 mg  50 mg Oral QHS PRN Lauro Franklin, MD       ziprasidone (GEODON) injection 20 mg  20 mg Intramuscular BID PRN Onuoha, Chinwendu V, NP       PTA Medications: Medications Prior to Admission  Medication Sig Dispense Refill Last Dose/Taking   QUEtiapine (SEROQUEL) 50 MG tablet Take 1 tablet (50 mg total) by mouth at bedtime. 30 tablet 0     Patient Stressors: Health problems   Other: Depression, suicidal ideation    Patient  Strengths: Printmaker for treatment/growth  Supportive family/friends   Treatment Modalities: Medication Management, Group therapy, Case management,  1 to 1 session with clinician, Psychoeducation, Recreational therapy.   Physician Treatment Plan for Primary Diagnosis: Substance induced mood disorder (HCC) Long Term Goal(s): Improvement in symptoms so as ready for discharge   Short Term Goals: Ability to identify changes in lifestyle to reduce recurrence of condition will improve Ability to verbalize feelings will improve Ability to disclose and discuss suicidal ideas Ability to demonstrate self-control will improve Ability to identify and develop effective coping behaviors will improve Ability to identify triggers associated with substance abuse/mental health issues will improve  Medication Management: Evaluate patient's response, side effects, and tolerance of medication regimen.  Therapeutic Interventions: 1 to 1 sessions, Unit Group sessions and Medication administration.  Evaluation of Outcomes: Not Progressing  Physician Treatment Plan for Secondary Diagnosis: Principal Problem:   Substance induced mood disorder (HCC)  Long Term Goal(s): Improvement in symptoms so as ready for discharge   Short Term Goals: Ability to identify changes in lifestyle to reduce recurrence of condition will improve Ability to verbalize feelings will improve Ability to disclose and discuss suicidal ideas Ability to demonstrate self-control  will improve Ability to identify and develop effective coping behaviors will improve Ability to identify triggers associated with substance abuse/mental health issues will improve     Medication Management: Evaluate patient's response, side effects, and tolerance of medication regimen.  Therapeutic Interventions: 1 to 1 sessions, Unit Group sessions and Medication administration.  Evaluation of Outcomes: Not Progressing   RN Treatment  Plan for Primary Diagnosis: Substance induced mood disorder (HCC) Long Term Goal(s): Knowledge of disease and therapeutic regimen to maintain health will improve  Short Term Goals: Ability to remain free from injury will improve, Ability to verbalize frustration and anger appropriately will improve, Ability to demonstrate self-control, Ability to participate in decision making will improve, Ability to verbalize feelings will improve, Ability to disclose and discuss suicidal ideas, Ability to identify and develop effective coping behaviors will improve, and Compliance with prescribed medications will improve  Medication Management: RN will administer medications as ordered by provider, will assess and evaluate patient's response and provide education to patient for prescribed medication. RN will report any adverse and/or side effects to prescribing provider.  Therapeutic Interventions: 1 on 1 counseling sessions, Psychoeducation, Medication administration, Evaluate responses to treatment, Monitor vital signs and CBGs as ordered, Perform/monitor CIWA, COWS, AIMS and Fall Risk screenings as ordered, Perform wound care treatments as ordered.  Evaluation of Outcomes: Not Progressing   LCSW Treatment Plan for Primary Diagnosis: Substance induced mood disorder (HCC) Long Term Goal(s): Safe transition to appropriate next level of care at discharge, Engage patient in therapeutic group addressing interpersonal concerns.  Short Term Goals: Engage patient in aftercare planning with referrals and resources, Increase social support, Increase ability to appropriately verbalize feelings, Increase emotional regulation, Facilitate acceptance of mental health diagnosis and concerns, Facilitate patient progression through stages of change regarding substance use diagnoses and concerns, Identify triggers associated with mental health/substance abuse issues, and Increase skills for wellness and recovery  Therapeutic  Interventions: Assess for all discharge needs, 1 to 1 time with Social worker, Explore available resources and support systems, Assess for adequacy in community support network, Educate family and significant other(s) on suicide prevention, Complete Psychosocial Assessment, Interpersonal group therapy.  Evaluation of Outcomes: Not Progressing   Progress in Treatment: Attending groups: No. Participating in groups: No. Taking medication as prescribed: none scheduled Toleration medication: none scheduled Family/Significant other contact made: No, will contact:  consents pending Patient understands diagnosis: Yes. Discussing patient identified problems/goals with staff: Yes. Medical problems stabilized or resolved: Yes. Denies suicidal/homicidal ideation: Yes. Issues/concerns per patient self-inventory: No.  New problem(s) identified: No, Describe:  none  New Short Term/Long Term Goal(s): detox, medication management for mood stabilization; elimination of SI thoughts; development of comprehensive mental wellness/sobriety plan   Patient Goals:  "Find an [facility] or some kind of drug treatment, I need help"  Discharge Plan or Barriers: Patient recently admitted. CSW will continue to follow and assess for appropriate referrals and possible discharge planning.    Reason for Continuation of Hospitalization: Anxiety Depression Medication stabilization Suicidal ideation Withdrawal symptoms  Estimated Length of Stay: 5-7 days  Last 3 [place] Suicide Severity Risk Score: Flowsheet Row [facility] Most recent reading at 06/03/2023  6:00 AM ED from [facility]/03/20[facility] in [facility] Most recent reading at 06/03/2023  2:30 AM Admission (Discharged) [facility] Most recent reading at 04/03/2023  5:15 AM  C-SSRS RISK CATEGORY Moderate Risk Moderate  Risk High  Risk       Last PHQ 2/9 Scores:    01/20/2022    9:16 AM 01/03/2022   11:46 AM 01/02/2022   11:45 AM  Depression screen PHQ 2/9  Decreased Interest 2 0 2  Down, Depressed, Hopeless 3 2 1   PHQ - 2 Score 5 2 3   Altered sleeping 3 0 2  Tired, decreased energy 2 0 2  Change in appetite 2 0 1  Feeling bad or failure about yourself  3 0 1  Trouble concentrating 2 0 1  Moving slowly or fidgety/restless 2 0 1  Suicidal thoughts 2 0 0  PHQ-9 Score 21 2 11   Difficult doing work/chores Very difficult Not difficult at all Somewhat difficult    Scribe for Treatment Team: Kathi Der, LCSWA 06/03/2023 1:53 PM

## 2023-06-03 NOTE — Tx Team (Signed)
Initial Treatment Plan 06/03/2023 6:06 AM Dondra Prader [id number]    PATIENT STRESSORS: Health problems   Other: Depression, suicidal ideation     PATIENT STRENGTHS: Communication skills  Motivation for treatment/growth  Supportive family/friends    PATIENT IDENTIFIED PROBLEMS: Depression  Suicidal Ideation   Lack of sleep                 DISCHARGE CRITERIA:  Improved stabilization in mood, thinking, and/or behavior  PRELIMINARY DISCHARGE PLAN: Return to previous living arrangement  PATIENT/FAMILY INVOLVEMENT: This treatment plan has been presented to and reviewed with the patient, SONG GARRIS, and/or family member, .  The patient and family have been given the opportunity to ask questions and make suggestions.  Jarome Matin, RN 06/03/2023, 6:06 AM
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
## 2023-06-03 NOTE — Plan of Care (Signed)
   Problem: Education: Goal: Emotional status will improve Outcome: Not Progressing Goal: Mental status will improve Outcome: Not Progressing   Problem: Activity: Goal: Interest or engagement in activities will improve Outcome: Not Progressing

## 2023-06-03 NOTE — ED Notes (Signed)
Report given to [facility] RN@[facility]  adult unit
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
## 2023-06-03 NOTE — [facility] Suicide Risk Assessment (Signed)
[facility] INPATIENT:  Family/Significant Other Suicide Prevention Education  Suicide Prevention Education:  Patient Refusal for Family/Significant Other Suicide Prevention Education: The patient Colton Blair has refused to provide written consent for family/significant other to be provided Family/Significant Other Suicide Prevention Education during admission and/or prior to discharge.  Physician notified.  Kathi Der 06/03/2023, 3:32 PM

## 2023-06-03 NOTE — Progress Notes (Signed)
Admission Note: report received from [facility] RN patient is a 77 VOL year old male from [facility] Pt was calm and cooperative during assessment. Denies SI/HI/AVH. Admission plan of care reviewed with pt, consent signed.  Personal belongings/skin assessment completed.   No contraband found.  Patient oriented to the unit, staff and room.  Routine safety checks initiated.  Verbalizes understanding of unit rules/protocols.   Patient is presently safe on the unit. No unsafe behaviors noted.  Q 15 minute safety checks maintained per unit protocol.

## 2023-06-03 NOTE — Group Note (Signed)
Date:  06/03/2023 Time:  9:02 AM  Group Topic/Focus:  Developing a Wellness Toolbox:   The focus of this group is to help patients develop a "wellness toolbox" with skills and strategies to promote recovery upon discharge.    Participation Level:  Did Not Attend   Erasmo Score 06/03/2023, 9:02 AM

## 2023-06-04 DIAGNOSIS — F1994 Other psychoactive substance use, unspecified with psychoactive substance-induced mood disorder: Secondary | ICD-10-CM | POA: Diagnosis not present

## 2023-06-04 MED ORDER — SERTRALINE HCL 25 MG PO TABS
25.0000 mg | ORAL_TABLET | Freq: Every day | ORAL | Status: DC
  Administered 2023-06-05: 25 mg via ORAL
  Filled 2023-06-04 (×3): qty 1

## 2023-06-04 NOTE — Group Note (Signed)
LCSW Group Therapy Note   Group Date: 06/04/2023 Start Time: 1100 End Time: 1200  Participation:  did not attend  Type of Therapy:  Group Therapy   Topic:  Finding Balance: Using Wise Mind for Thoughtful Decisions  Objective: This class aims to help participants understand the concept of [place] Mind and apply it in real-life situations for more balanced, thoughtful decision-making. Participants will gain tools to manage emotions, consider logic, and find a middle ground for healthier responses and outcomes.  Goals: 1.  Understand Wise Mind: Learn the difference between Emotional Mind, Reasonable Mind, and [place] Mind, and how [place] Mind helps balance emotions and logic for thoughtful decisions. 2.  Recognize Emotional and Reasonable Mind: Identify signs of Emotional Mind and Reasonable Mind in personal reactions and how to shift into Wise Mind for balanced responses. 3.  Practice Wise Mind: Engage in scenarios and activities to apply [employer], combining emotions and logic for balanced, thoughtful decisions.  Summary In this class, we explored Wise Mind as the balance between Emotional Mind (impulsive reactions) and Reasonable Mind (logic-focused responses). We discussed how to identify when we're in each state and practiced using Wise Mind to make more balanced, thoughtful decisions in everyday situations, improving decision-making and relationships.  Therapeutic Modalities This class incorporates Dialectical Behavior Therapy (DBT), mindfulness techniques, and emotion regulation strategies to help participants balance emotional and logical responses in their decision-making process.     Alla Feeling, LCSWA 06/04/2023  7:56 PM

## 2023-06-04 NOTE — Progress Notes (Addendum)
D. Pt presented depressed, guarded , speech very soft-mumbling, upon initial approach this morning -Pt denied SI/HI and A/VH and did not appear to be responding to internal stimuli. Pt reported sleeping well last night and voiced no complaints. When asked about a goal, pt responded, "I wanna get my mind right."  Pt remained in his room for most of the shift, did not attend groups. A. Labs and vitals monitored. Pt supported emotionally and encouraged to express concerns and ask questions.   R. Pt remains safe with 15 minute checks. Will continue POC.    06/04/23 0900  Psych Admission Type (Psych Patients Only)  Admission Status Voluntary  Psychosocial Assessment  Patient Complaints Depression  Eye Contact Fair  Facial Expression Sad  Affect Depressed  Speech Soft (mumbles)  Interaction Guarded  Motor Activity Slow  Appearance/Hygiene Disheveled  Behavior Characteristics Cooperative;Appropriate to situation  Mood Depressed  Thought Process  Coherency WDL  Content WDL  Delusions None reported or observed  Perception WDL  Hallucination None reported or observed  Judgment WDL  Confusion None  Danger to Self  Current suicidal ideation? Denies  Danger to Others  Danger to Others None reported or observed

## 2023-06-04 NOTE — Group Note (Signed)
Date:  06/04/2023 Time:  10:24 PM  Group Topic/Focus:  Wrap-Up Group:   The focus of this group is to help patients review their daily goal of treatment and discuss progress on daily workbooks.    Participation Level:  Did Not Attend  Participation Quality:   Patient did not attend group  Affect:  Patient did not attend group  Cognitive:  Patient did not attend group  Insight: None  Engagement in Group:  Patient did not attend group  Modes of Intervention:  Patient did not attend group  Additional Comments:  Patient did not attend wrap up group  Colton Blair 06/04/2023, 10:24 PM
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
## 2023-06-04 NOTE — Plan of Care (Signed)
   Problem: Physical Regulation: Goal: Ability to maintain clinical measurements within normal limits will improve Outcome: Progressing   Problem: Safety: Goal: Periods of time without injury will increase Outcome: Progressing

## 2023-06-04 NOTE — Progress Notes (Signed)

## 2023-06-04 NOTE — Progress Notes (Signed)
Housing Resources:   [facility] vacancies:   CSW provided a Producer, television/film/video for the [place] locations. CSW informed pt to call and conduct interview screening required for admission.    [facility]  [place] [phone] Interviews Daily 6:00pm  [facility] number: [phone]   [place] [phone]  Interviews Tue 5:30pm   [place]  [phone]  Interviews Daily 9:30pm   [place]  [phone]      Interviews Daily 7:00pm  Joya Gaskins  [place] [phone] Caryn Bee [phone] Interviews Wynelle Link 1:00pm  [place] [phone]  [place] [phone]  Interviews Wynelle Link 5:00pm  Debbora Lacrosse [place]  [phone] [place] [phone]  Interviews Wynelle Link 5:00pm   [place] [phone]  Dorene Sorrow [phone]  Interviews Mon 6:00pm   [place] [phone] Marita [place] [phone]  Interviews Daily 7:00pm  Charna Elizabeth  [phone]  Trey Paula [phone]  InterviewsSun 9:00pm  Steffanie Dunn LCSWA

## 2023-06-05 DIAGNOSIS — F1994 Other psychoactive substance use, unspecified with psychoactive substance-induced mood disorder: Secondary | ICD-10-CM | POA: Diagnosis not present

## 2023-06-05 LAB — BASIC METABOLIC PANEL
Anion gap: 7 (ref 5–15)
BUN: 9 mg/dL (ref 6–20)
CO2: 29 mmol/L (ref 22–32)
Calcium: 9 mg/dL (ref 8.9–10.3)
Chloride: 106 mmol/L (ref 98–111)
Creatinine, Ser: 1.1 mg/dL (ref 0.61–1.24)
GFR, Estimated: >60 mL/min (ref >60)
Glucose, Bld: 91 mg/dL (ref 70–99)
Potassium: 3.9 mmol/L (ref 3.5–5.1)
Sodium: 142 mmol/L (ref 135–145)

## 2023-06-05 LAB — VITAMIN B12: Vitamin B-12: 260 pg/mL (ref 180–914)

## 2023-06-05 LAB — FOLATE: Folate: 8.2 ng/mL (ref >5.9)

## 2023-06-05 LAB — MAGNESIUM: Magnesium: 2.1 mg/dL (ref 1.7–2.4)

## 2023-06-05 MED ORDER — SERTRALINE HCL 50 MG PO TABS
50.0000 mg | ORAL_TABLET | Freq: Every day | ORAL | Status: DC
  Administered 2023-06-06: 50 mg via ORAL
  Filled 2023-06-05 (×3): qty 1

## 2023-06-05 MED ORDER — RISPERIDONE 1 MG PO TABS
1.0000 mg | ORAL_TABLET | Freq: Every day | ORAL | Status: DC
  Filled 2023-06-05: qty 1

## 2023-06-05 MED ORDER — QUETIAPINE FUMARATE 50 MG PO TABS
50.0000 mg | ORAL_TABLET | Freq: Every day | ORAL | Status: DC
  Administered 2023-06-05: 50 mg via ORAL
  Filled 2023-06-05 (×4): qty 1

## 2023-06-05 NOTE — Progress Notes (Signed)
   06/04/23 2201  Psych Admission Type (Psych Patients Only)  Admission Status Voluntary  Psychosocial Assessment  Patient Complaints Depression  Eye Contact Fair  Facial Expression Sad  Affect Depressed  Speech Soft  Interaction Guarded  Motor Activity Slow  Appearance/Hygiene Body odor;Disheveled  Behavior Characteristics Guarded  Mood Depressed  Aggressive Behavior  Effect No apparent injury  Thought Process  Coherency WDL  Content WDL  Delusions None reported or observed  Perception WDL  Hallucination None reported or observed  Judgment WDL  Confusion None  Danger to Self  Current suicidal ideation? Denies  Agreement Not to Harm Self Yes  Description of Agreement Verbal  Danger to Others  Danger to Others None reported or observed

## 2023-06-05 NOTE — Progress Notes (Signed)
Patients reports sleeping okay but states he feels very tired and drowsy this morning after receiving trazodone last night. Patient reports taking Seroquel in the past and he stated that was helping. Patient denies any suicidal ideations "right now" an no hallucinations reported. Patient reports feeling of hopelessness today a 5/10 and anxiety a 5/10 as well. Patient reports experiencing tremors. Patient goal for today is "to be with my kids" and how he plans to meet this goal is "to do what I have to do."

## 2023-06-05 NOTE — Plan of Care (Signed)
  Problem: Education: Goal: Knowledge of [facility] Education information/materials will improve Outcome: Progressing Goal: Emotional status will improve Outcome: Progressing Goal: Mental status will improve Outcome: Progressing Goal: Verbalization of understanding the information provided will improve Outcome: Progressing   Problem: Activity: Goal: Sleeping patterns will improve Outcome: Progressing   Problem: Coping: Goal: Ability to verbalize frustrations and anger appropriately will improve Outcome: Progressing   Problem: Safety: Goal: Periods of time without injury will increase Outcome: Progressing

## 2023-06-05 NOTE — [facility] Group Notes (Signed)
[facility] Group Notes:  (Nursing/MHT/Case Management/Adjunct)  Date:  06/05/2023  Time:  8:43 PM  Type of Therapy:   Wrap-up group  Participation Level:  Did Not Attend  Participation Quality:    Affect:    Cognitive:    Insight:    Engagement in Group:    Modes of Intervention:    Summary of Progress/Problems: Pt refused to attend group.  Colton Blair 06/05/2023, 8:43 PM

## 2023-06-05 NOTE — Plan of Care (Signed)
Pt denies SI/HI/AV. Pt is not compliant with medication administration. Expressed concerns of prescribed meds vs previously prescribed. P Pt was offered support and encouragement. Pt was given scheduled medications. Pt was encourage to attend groups. Q 15 minute safety checks performed/ongoing, Pt did not attend groups this evening.  Pt partially receptive to treatment and safety maintained on unit.   Problem: Education: Goal: Knowledge of [facility] Education information/materials will improve Outcome: Not Progressing Goal: Emotional status will improve Outcome: Not Progressing Goal: Mental status will improve Outcome: Not Progressing Goal: Verbalization of understanding the information provided will improve Outcome: Not Progressing   Problem: Activity: Goal: Interest or engagement in activities will improve Outcome: Not Progressing Goal: Sleeping patterns will improve Outcome: Not Progressing   Problem: Coping: Goal: Ability to verbalize frustrations and anger appropriately will improve Outcome: Not Progressing Goal: Ability to demonstrate self-control will improve Outcome: Not Progressing   Problem: Health Behavior/Discharge Planning: Goal: Identification of resources available to assist in meeting health care needs will improve Outcome: Not Progressing Goal: Compliance with treatment plan for underlying cause of condition will improve Outcome: Not Progressing   Problem: Physical Regulation: Goal: Ability to maintain clinical measurements within normal limits will improve Outcome: Not Progressing   Problem: Safety: Goal: Periods of time without injury will increase Outcome: Not Progressing
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
## 2023-06-05 NOTE — Progress Notes (Signed)
   06/05/23 0614  Vital Signs  Temp 98.5 F (36.9 C)  Temp Source Oral  Pulse Rate 90  Pulse Rate Source Monitor  Resp 18  BP 106/70  BP Location Right Arm  BP Method Automatic  Patient Position (if appropriate) Sitting  Oxygen Therapy  SpO2 96 %  O2 Device Room Air

## 2023-06-05 NOTE — Progress Notes (Signed)
   06/05/23 0612  15 Minute Checks  Location Dayroom  Visual Appearance Calm  Behavior Composed  Sleep (Behavioral Health Patients Only)  Calculate sleep? (Click Yes once per 24 hr at 0600 safety check) Yes  Documented sleep last 24 hours 8.75

## 2023-06-05 NOTE — Group Note (Signed)
Recreation Therapy Group Note   Group Topic:Team Building  Group Date: 06/05/2023 Start Time: 0930 End Time: 0956 Facilitators: Magdalyn Arenivas-McCall, LRT,CTRS Location: 300 Hall Dayroom   Group Topic: Communication, Team Building, Problem Solving  Goal Area(s) Addresses:  Patient will effectively work with peer towards shared goal.  Patient will identify skills used to make activity successful.  Patient will identify how skills used during activity can be used to reach post d/c goals.   Intervention: STEM Activity  Activity: Stage manager. In teams of 3-5, patients were given 12 plastic drinking straws and an equal length of masking tape. Using the materials provided, patients were asked to build a landing pad to catch a golf ball dropped from approximately 5 feet in the air. All materials were required to be used by the team in their design. LRT facilitated post-activity discussion.  Education: Pharmacist, community, Scientist, physiological, Discharge Planning   Education Outcome: Acknowledges education/In group clarification offered/Needs additional education.    Affect/Mood: N/A   Participation Level: Did not attend    Clinical Observations/Individualized Feedback:     Plan: Continue to engage patient in RT group sessions 2-3x/week.   Ashantee Deupree-McCall, LRT,CTRS 06/05/2023 12:17 PM

## 2023-06-06 DIAGNOSIS — F1994 Other psychoactive substance use, unspecified with psychoactive substance-induced mood disorder: Secondary | ICD-10-CM | POA: Diagnosis not present

## 2023-06-06 LAB — RPR: RPR Ser Ql: NONREACTIVE

## 2023-06-06 LAB — VITAMIN D 25 HYDROXY (VIT D DEFICIENCY, FRACTURES): Vit D, 25-Hydroxy: 16.96 ng/mL — ABNORMAL LOW (ref 30–100)

## 2023-06-06 MED ORDER — VITAMIN D3 25 MCG PO TABS
2000.0000 [IU] | ORAL_TABLET | Freq: Two times a day (BID) | ORAL | Status: DC
  Administered 2023-06-08 – 2023-06-09 (×2): 2000 [IU] via ORAL
  Filled 2023-06-06 (×9): qty 2

## 2023-06-06 MED ORDER — VITAMIN D (ERGOCALCIFEROL) 1.25 MG (50000 UNIT) PO CAPS
50000.0000 [IU] | ORAL_CAPSULE | ORAL | Status: DC
  Administered 2023-06-06: 50000 [IU] via ORAL
  Filled 2023-06-06: qty 1

## 2023-06-06 MED ORDER — QUETIAPINE FUMARATE 100 MG PO TABS
100.0000 mg | ORAL_TABLET | Freq: Every day | ORAL | Status: DC
  Administered 2023-06-06 – 2023-06-08 (×3): 100 mg via ORAL
  Filled 2023-06-06: qty 1
  Filled 2023-06-06 (×3): qty 0.5
  Filled 2023-06-06: qty 1
  Filled 2023-06-06: qty 7
  Filled 2023-06-06: qty 0.5

## 2023-06-06 NOTE — Plan of Care (Signed)
 Nurse discussed anxiety, depression and coping skills with patient.

## 2023-06-06 NOTE — Progress Notes (Signed)
D:  Patient denied SI and HI, contracts for safety.  Denied A/V hallucinations.   A:  Medications administered per MD orders.  Emotional support and encouragement given patient. R:  Safety maintained with 15 minute checks.  

## 2023-06-06 NOTE — Progress Notes (Signed)
Pt did not attend goals group. 

## 2023-06-06 NOTE — Progress Notes (Signed)
 Pt did not attend group.
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
## 2023-06-06 NOTE — Group Note (Signed)
 [facility] LCSW Group Therapy Note   Group Date: 06/06/2023 Start Time: 1000 End Time: 1100   Type of Therapy/Topic:  Group Therapy:  Emotion Regulation  Participation Level:  Did Not Attend   Mood:  Description of Group:    The purpose of this group is to assist patients in learning to regulate negative emotions and experience positive emotions. Patients will be guided to discuss ways in which they have been vulnerable to their negative emotions. These vulnerabilities will be juxtaposed with experiences of positive emotions or situations, and patients challenged to use positive emotions to combat negative ones. Special emphasis will be placed on coping with negative emotions in conflict situations, and patients will process healthy conflict resolution skills.  Therapeutic Goals: Patient will identify two positive emotions or experiences to reflect on in order to balance out negative emotions:  Patient will label two or more emotions that they find the most difficult to experience:  Patient will be able to demonstrate positive conflict resolution skills through discussion or role plays:   Summary of Patient Progress:    Therapeutic Modalities:   Cognitive Behavioral Therapy Feelings Identification Dialectical Behavioral Therapy   Laural Roes Karrisa Didio, LCSWA 06/06/2023  12:24 PM

## 2023-06-06 NOTE — Group Note (Signed)
 Date:  06/06/2023 Time:  3:15 PM  Group Topic/Focus:  Self Esteem:   [name] can help build self-esteem and confidence through singing, social engagement, and positive reinforcement. It can also help people express themselves and release tension   Participation Level:  Did Not Attend  Colton Blair 06/06/2023, 3:15 PM

## 2023-06-07 DIAGNOSIS — F1994 Other psychoactive substance use, unspecified with psychoactive substance-induced mood disorder: Secondary | ICD-10-CM | POA: Diagnosis not present

## 2023-06-07 NOTE — Plan of Care (Signed)
 Nurse discussed anxiety, depression and coping skills with patient.

## 2023-06-07 NOTE — Progress Notes (Signed)
 D:  Patient denied SI and HI, contracts for safety.  Denied A/V hallucinations.  Denied pain. A:  Patient declined vitamins this morning.  Patient stated "I'm feeling good.  I slept good. I don't need any medicine."   Emotional support and encouragement given patient. R:  Safety maintained with 15 minute checks. EKG completed this morning per MD order.
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
## 2023-06-07 NOTE — Plan of Care (Signed)
   Problem: Health Behavior/Discharge Planning: Goal: Identification of resources available to assist in meeting health care needs will improve Outcome: Progressing Goal: Compliance with treatment plan for underlying cause of condition will improve Outcome: Progressing   Problem: Physical Regulation: Goal: Ability to maintain clinical measurements within normal limits will improve Outcome: Progressing

## 2023-06-07 NOTE — Group Note (Unsigned)
 Date:  06/08/2023 Time:  1:03 AM  Group Topic/Focus:  Wrap-Up Group:   The focus of this group is to help patients review their daily goal of treatment and discuss progress on daily workbooks.    Participation Level:  Active  Participation Quality:  Appropriate and Sharing  Affect:  Appropriate  Cognitive:  Appropriate  Insight: Appropriate  Engagement in Group:  Engaged  Modes of Intervention:  Discussion and Socialization  Additional Comments:  Patients used wrap up group sheets during group and shared.   Colton Blair 06/08/2023, 1:03 AM

## 2023-06-07 NOTE — [facility] Group Notes (Signed)
Pt did not attend goals/orientation group.  

## 2023-06-07 NOTE — [facility] Group Notes (Signed)
 Pt did not attend group activity.

## 2023-06-07 NOTE — Group Note (Signed)
 Date:  06/07/2023 Time:  2:46 PM  Group Topic/Focus:  Building Self Esteem:   The Focus of this group is helping patients become aware of the effects of self-esteem on their lives, the things they and others do that enhance or undermine their self-esteem, seeing the relationship between their level of self-esteem and the choices they make and learning ways to enhance self-esteem. Managing Feelings:   The focus of this group is to identify what feelings patients have difficulty handling and develop a plan to handle them in a healthier way upon discharge.    Participation Level:  Active  Participation Quality:  Appropriate  Affect:  Appropriate  Cognitive:  Appropriate  Insight: Appropriate  Engagement in Group:  Engaged  Modes of Intervention:  Socialization  Additional Comments:    Gardiner Barefoot 06/07/2023, 2:46 PM

## 2023-06-08 ENCOUNTER — Encounter (HOSPITAL_COMMUNITY): Payer: Self-pay

## 2023-06-08 DIAGNOSIS — F1994 Other psychoactive substance use, unspecified with psychoactive substance-induced mood disorder: Secondary | ICD-10-CM | POA: Diagnosis not present

## 2023-06-08 LAB — BASIC METABOLIC PANEL
Anion gap: 10 (ref 5–15)
BUN: 12 mg/dL (ref 6–20)
CO2: 27 mmol/L (ref 22–32)
Calcium: 8.7 mg/dL — ABNORMAL LOW (ref 8.9–10.3)
Chloride: 105 mmol/L (ref 98–111)
Creatinine, Ser: 0.99 mg/dL (ref 0.61–1.24)
GFR, Estimated: >60 mL/min (ref >60)
Glucose, Bld: 85 mg/dL (ref 70–99)
Potassium: 3.7 mmol/L (ref 3.5–5.1)
Sodium: 142 mmol/L (ref 135–145)

## 2023-06-08 LAB — MAGNESIUM: Magnesium: 2 mg/dL (ref 1.7–2.4)

## 2023-06-08 MED ORDER — POTASSIUM CHLORIDE 20 MEQ PO PACK
40.0000 meq | PACK | Freq: Once | ORAL | Status: DC
  Filled 2023-06-08 (×2): qty 2
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
## 2023-06-08 NOTE — Group Note (Signed)
 Recreation Therapy Group Note   Group Topic:Health and Wellness  Group Date: 06/08/2023 Start Time: 0931 End Time: 0950 Facilitators: Vester Balthazor-McCall, LRT,CTRS Location: [address] Dayroom   Group Topic: Wellness  Goal Area(s) Addresses:  Patient will define components of whole wellness. Patient will verbalize benefit of whole wellness.  Intervention: Music  Activity: Exercise. LRT instructed patients they would take turns leading the group in the exercises of their choosing. Patients were encouraged to give their best but if they needed to take a break or get water to do so. Patients were also encouraged to give their best effort throughout group session.   Education: Wellness, Building control surveyor.   Education Outcome: Acknowledges education/In group clarification offered/Needs additional education.   Affect/Mood: Appropriate   Participation Level: Moderate   Participation Quality: Independent   Behavior: Appropriate   Speech/Thought Process: Relevant   Insight: Moderate   Judgement: Moderate   Modes of Intervention: Music   Patient Response to Interventions:  Interested    Education Outcome:  In group clarification offered    Clinical Observations/Individualized Feedback: Pt came in for the last few minutes of group. Pt led the group in an exercise when he came into group. Pt was pleasant and bright while in group session.    Plan: Continue to engage patient in RT group sessions 2-3x/week.   Linder Prajapati-McCall, LRT,CTRS 06/08/2023 12:43 PM

## 2023-06-08 NOTE — Plan of Care (Signed)
   Problem: Education: Goal: Emotional status will improve Outcome: Progressing Goal: Mental status will improve Outcome: Progressing Goal: Verbalization of understanding the information provided will improve Outcome: Progressing

## 2023-06-08 NOTE — Progress Notes (Signed)
 After taking a sip of the klorcon, patient stated, "This shit nasty as hell. I'm not drinking that." Patient educated on effects of potassium imbalance. Patient verbalized understanding, but still refused. Patient educated on different routes of administration for potassium and stated that he would still refuse.

## 2023-06-08 NOTE — BHH Group Notes (Signed)
 Spiritual care group on grief and loss facilitated by Chaplain Dyanne Carrel, Bcc  Group Goal: Support / Education around grief and loss  Members engage in facilitated group support and psycho-social education.  Group Description:  Following introductions and group rules, group members engaged in facilitated group dialogue and support around topic of loss, with particular support around experiences of loss in their lives. Group Identified types of loss (relationships / self / things) and identified patterns, circumstances, and changes that precipitate losses. Reflected on thoughts / feelings around loss, normalized grief responses, and recognized variety in grief experience. Group encouraged individual reflection on safe space and on the coping skills that they are already utilizing.  Group drew on Adlerian / Rogerian and narrative framework  Patient Progress: Colton Blair attended group. His verbal participation was minimal, but he demonstrated engagement in the conversation.

## 2023-06-08 NOTE — BH IP Treatment Plan (Signed)
 Interdisciplinary Treatment and Diagnostic Plan Update  06/08/2023 Time of Session: 1546 - UPDATE DEXTON ZWILLING MRN: [id number]  Principal Diagnosis: Substance induced mood disorder (HCC)  Secondary Diagnoses: Principal Problem:   Substance induced mood disorder (HCC)   Current Medications:  Current Facility-Administered Medications  Medication Dose Route Frequency Provider Last Rate Last Admin   acetaminophen (TYLENOL) tablet 650 mg  650 mg Oral Q6H PRN Lauro Franklin, MD   650 mg at 06/05/23 0618   alum & mag hydroxide-simeth (MAALOX/MYLANTA) 200-200-20 MG/5ML suspension 30 mL  30 mL Oral Q6H PRN Lauro Franklin, MD       magnesium hydroxide (MILK OF MAGNESIA) suspension 30 mL  30 mL Oral Daily PRN Lauro Franklin, MD       multivitamin with minerals tablet 1 tablet  1 tablet Oral Daily Pashayan, Mardelle Matte, MD   1 tablet at 06/08/23 1253   potassium chloride (KLOR-CON) packet 40 mEq  40 mEq Oral Once Lauro Franklin, MD       QUEtiapine (SEROQUEL) tablet 100 mg  100 mg Oral QHS Lauro Franklin, MD   100 mg at 06/07/23 2121   thiamine (Vitamin B-1) tablet 100 mg  100 mg Oral Daily Lauro Franklin, MD   100 mg at 06/08/23 0800   traZODone (DESYREL) tablet 50 mg  50 mg Oral QHS PRN Lauro Franklin, MD   50 mg at 06/04/23 2200   Vitamin D (Ergocalciferol) (DRISDOL) 1.25 MG (50000 UNIT) capsule 50,000 Units  50,000 Units Oral Q7 days Golda Acre, MD   50,000 Units at 06/06/23 1200   vitamin D3 (CHOLECALCIFEROL) tablet 2,000 Units  2,000 Units Oral BID Golda Acre, MD       ziprasidone (GEODON) injection 20 mg  20 mg Intramuscular BID PRN Onuoha, Chinwendu V, NP       PTA Medications: Medications Prior to Admission  Medication Sig Dispense Refill Last Dose/Taking   QUEtiapine (SEROQUEL) 50 MG tablet Take 1 tablet (50 mg total) by mouth at bedtime. 30 tablet 0     Patient Stressors: Health problems   Other: Depression, suicidal  ideation    Patient Strengths: Printmaker for treatment/growth  Supportive family/friends   Treatment Modalities: Medication Management, Group therapy, Case management,  1 to 1 session with clinician, Psychoeducation, Recreational therapy.   Physician Treatment Plan for Primary Diagnosis: Substance induced mood disorder (HCC) Long Term Goal(s): Improvement in symptoms so as ready for discharge   Short Term Goals: Ability to identify changes in lifestyle to reduce recurrence of condition will improve Ability to verbalize feelings will improve Ability to disclose and discuss suicidal ideas Ability to demonstrate self-control will improve Ability to identify and develop effective coping behaviors will improve Ability to identify triggers associated with substance abuse/mental health issues will improve  Medication Management: Evaluate patient's response, side effects, and tolerance of medication regimen.  Therapeutic Interventions: 1 to 1 sessions, Unit Group sessions and Medication administration.  Evaluation of Outcomes: Progressing  Physician Treatment Plan for Secondary Diagnosis: Principal Problem:   Substance induced mood disorder (HCC)  Long Term Goal(s): Improvement in symptoms so as ready for discharge   Short Term Goals: Ability to identify changes in lifestyle to reduce recurrence of condition will improve Ability to verbalize feelings will improve Ability to disclose and discuss suicidal ideas Ability to demonstrate self-control will improve Ability to identify and develop effective coping behaviors will improve Ability to identify triggers associated with substance abuse/mental  health issues will improve     Medication Management: Evaluate patient's response, side effects, and tolerance of medication regimen.  Therapeutic Interventions: 1 to 1 sessions, Unit Group sessions and Medication administration.  Evaluation of Outcomes:  Progressing   RN Treatment Plan for Primary Diagnosis: Substance induced mood disorder (HCC) Long Term Goal(s): Knowledge of disease and therapeutic regimen to maintain health will improve  Short Term Goals: Ability to verbalize frustration and anger appropriately will improve, Ability to demonstrate self-control, Ability to participate in decision making will improve, Ability to verbalize feelings will improve, Ability to identify and develop effective coping behaviors will improve, and Compliance with prescribed medications will improve  Medication Management: RN will administer medications as ordered by provider, will assess and evaluate patient's response and provide education to patient for prescribed medication. RN will report any adverse and/or side effects to prescribing provider.  Therapeutic Interventions: 1 on 1 counseling sessions, Psychoeducation, Medication administration, Evaluate responses to treatment, Monitor vital signs and CBGs as ordered, Perform/monitor CIWA, COWS, AIMS and Fall Risk screenings as ordered, Perform wound care treatments as ordered.  Evaluation of Outcomes: Progressing   LCSW Treatment Plan for Primary Diagnosis: Substance induced mood disorder (HCC) Long Term Goal(s): Safe transition to appropriate next level of care at discharge, Engage patient in therapeutic group addressing interpersonal concerns.  Short Term Goals: Engage patient in aftercare planning with referrals and resources, Increase social support, Increase ability to appropriately verbalize feelings, Increase emotional regulation, Identify triggers associated with mental health/substance abuse issues, and Increase skills for wellness and recovery  Therapeutic Interventions: Assess for all discharge needs, 1 to 1 time with Social worker, Explore available resources and support systems, Assess for adequacy in community support network, Educate family and significant other(s) on suicide prevention,  Complete Psychosocial Assessment, Interpersonal group therapy.  Evaluation of Outcomes: Progressing   Progress in Treatment: Attending groups: No. Participating in groups: No. Taking medication as prescribed: none scheduled Toleration medication: none scheduled Family/Significant other contact made: No, will contact:  consents pending Patient understands diagnosis: Yes. Discussing patient identified problems/goals with staff: Yes. Medical problems stabilized or resolved: Yes. Denies suicidal/homicidal ideation: Yes. Issues/concerns per patient self-inventory: No.   New problem(s) identified: No, Describe:  none   New Short Term/Long Term Goal(s): detox, medication management for mood stabilization; elimination of SI thoughts; development of comprehensive mental wellness/sobriety plan     Patient Goals:  "Find an [facility] or some kind of drug treatment, I need help"   Discharge Plan or Barriers: Patient recently admitted. CSW will continue to follow and assess for appropriate referrals and possible discharge planning.      Reason for Continuation of Hospitalization: Anxiety Depression Medication stabilization Suicidal ideation Withdrawal symptoms    Reason for Continuation of Hospitalization: Medication stabilization Other; describe Mood stabilization  Estimated Length of Stay: 2-5 Days  Last 3 [place] Suicide Severity Risk Score: Flowsheet Row [facility] Most recent reading at 06/03/2023  6:00 AM ED from 06/03/2023 in [facility] Most recent reading at 06/03/2023  2:30 AM Ad[facility] (Discharged) [facility] Most recent reading at 04/03/2023  5:15 AM  C-SSRS RISK CATEGORY Moderate Risk Moderate Risk High Risk       Last PHQ 2/9 Scores:    01/20/2022    9:16 AM 01/03/2022   11:46 AM 01/02/2022   11:45 AM   Depression screen PHQ 2/9  Decreased Interest  2 0 2  Down, Depressed, Hopeless 3 2 1   PHQ - 2 Score 5 2 3   Altered sleeping 3 0 2  Tired, decreased energy 2 0 2  Change in appetite 2 0 1  Feeling bad or failure about yourself  3 0 1  Trouble concentrating 2 0 1  Moving slowly or fidgety/restless 2 0 1  Suicidal thoughts 2 0 0  PHQ-9 Score 21 2 11   Difficult doing work/chores Very difficult Not difficult at all Somewhat difficult    Scribe for Treatment Team: Jacinta Shoe, LCSW 06/08/2023 3:46 PM

## 2023-06-08 NOTE — Progress Notes (Signed)
   06/08/23 0800  Psych Admission Type (Psych Patients Only)  Admission Status Voluntary  Psychosocial Assessment  Patient Complaints None  Eye Contact Fair  Facial Expression Animated  Affect Appropriate to circumstance  Speech Logical/coherent  Interaction Assertive  Motor Activity Other (Comment) (WDL)  Appearance/Hygiene Unremarkable  Behavior Characteristics Cooperative;Appropriate to situation  Mood Anxious  Thought Process  Coherency WDL  Content WDL  Delusions None reported or observed  Perception WDL  Hallucination None reported or observed  Judgment Impaired  Confusion None  Danger to Self  Current suicidal ideation? Denies  Agreement Not to Harm Self Yes  Description of Agreement Verbal  Danger to Others  Danger to Others None reported or observed

## 2023-06-08 NOTE — BHH Group Notes (Signed)
 BHH Group Notes:  (Nursing/MHT/Case Management/Adjunct)  Date:  06/08/2023  Time:  10:27 PM  Type of Therapy:  Psychoeducational Skills  Participation Level:  Minimal  Participation Quality:  Inattentive  Affect:  Flat and Irritable  Cognitive:  Lacking  Insight:  Lacking  Engagement in Group:  Resistant  Modes of Intervention:  Education  Summary of Progress/Problems: Patient rated his day as an 8 out of 10. He states that he smiled a bit today. His positive event for the day is that he was "moving around" more today.   Hazle Coca S 06/08/2023, 10:27 PM

## 2023-06-08 NOTE — Group Note (Signed)
 Occupational Therapy Group Note  Group Topic: Sleep Hygiene  Group Date: 06/08/2023 Start Time: 1430 End Time: 1500 Facilitators: Ted Mcalpine, OT   Group Description: Group encouraged increased participation and engagement through topic focused on sleep hygiene. Patients reflected on the quality of sleep they typically receive and identified areas that need improvement. Group was given background information on sleep and sleep hygiene, including common sleep disorders. Group members also received information on how to improve one's sleep and introduced a sleep diary as a tool that can be utilized to track sleep quality over a length of time. Group session ended with patients identifying one or more strategies they could utilize or implement into their sleep routine in order to improve overall sleep quality.        Therapeutic Goal(s):  Identify one or more strategies to improve overall sleep hygiene  Identify one or more areas of sleep that are negatively impacted (sleep too much, too little, etc)     Participation Level: Engaged   Participation Quality: Independent   Behavior: Appropriate   Speech/Thought Process: Relevant   Affect/Mood: Appropriate   Insight: Fair   Judgement: Fair      Modes of Intervention: Education  Patient Response to Interventions:  Attentive   Plan: Continue to engage patient in OT groups 2 - 3x/week.  06/08/2023  Ted Mcalpine, OT Kerrin Champagne, OT

## 2023-06-09 DIAGNOSIS — F1994 Other psychoactive substance use, unspecified with psychoactive substance-induced mood disorder: Secondary | ICD-10-CM | POA: Diagnosis not present

## 2023-06-09 MED ORDER — VITAMIN D (ERGOCALCIFEROL) 1.25 MG (50000 UNIT) PO CAPS: 50000.0000 [IU] | ORAL_CAPSULE | ORAL | 0 refills | Status: AC

## 2023-06-09 MED ORDER — VITAMIN D3 25 MCG PO TABS: 2000.0000 [IU] | ORAL_TABLET | Freq: Two times a day (BID) | ORAL | 0 refills | Status: AC

## 2023-06-09 MED ORDER — ADULT MULTIVITAMIN W/MINERALS CH: 1.0000 | ORAL_TABLET | Freq: Every day | ORAL | 0 refills | Status: AC

## 2023-06-09 MED ORDER — QUETIAPINE FUMARATE 100 MG PO TABS
100.0000 mg | ORAL_TABLET | Freq: Every day | ORAL | 0 refills | Status: AC
Start: 2023-06-09 — End: ?

## 2023-06-09 MED ORDER — VITAMIN B-1 100 MG PO TABS: 100.0000 mg | ORAL_TABLET | Freq: Every day | ORAL | 0 refills | Status: AC

## 2023-06-09 NOTE — Progress Notes (Addendum)
  [facility] Adult Case Management Discharge Plan :  Will you be returning to the same living situation after discharge:  No, patient will go to his cousin's home Ellin Saba), located at [address] [place] At discharge, do you have transportation home?: Yes,  CSW arranged transportation through [employer] at 4:30 PM Do you have the ability to pay for your medications: No, patient doesn't have insurance but will receive samples.  Release of information consent forms completed and in the chart;  Patient's signature needed at discharge.  Patient to Follow up at:  Follow-up Information     [facility]. Go on 06/19/2023.   Specialty: Family Medicine Why: You have a hospital follow up appointment for primary care services on  06/19/23 at 10:00 am.  The appointment will be held in person. Contact information: [address] [place] [address] [phone] Additional information: [address]  [address]  [place], [place] [address]    - Parking lot with [employer], [employer], [employer]; Next to [facility] Urgent Care   - [facility] shopping - Off of [address]          [facility]. Go to.   Specialty: Behavioral Health Why: Please go to this provider on 06/16/2023 at 7 AM for medication management services.  If the time presented is not feasible, you can walk-in Monday through Friday, arrive by 7:00 am. Contact information: [address] [place] [address] [phone]        [place]. Go to.   Specialty: Professional Counselor Why: Please go to this provider on 06/10/2023 at 9 AM for therapy services.  If the time presented is not feasible, you can walk-in Monday through Friday, between 9 am and 1 pm. Contact information: [address] [place] [address] [phone]                 Next level of care  provider has access to [facility] Link:no  Safety Planning and Suicide Prevention discussed: Yes,  Ellin Saba (cousin) [phone]     Has patient been referred to the Quitline?: Patient does not use tobacco/nicotine products.  Patient said that he used to smoke but he no longer smokes.  He said that he didn't want a print-out.  Patient has been referred for addiction treatment: Patient refused referral for treatment.  Patient said that he used substances in the past but doesn't any more.   Colton Blair O Treacy Holcomb, LCSWA 06/09/2023, 10:00 AM

## 2023-06-09 NOTE — Progress Notes (Signed)
 Colton Blair  D/C'd Home per MD order.  Discussed with the patient and all questions fully answered.  An After Visit Summary was printed and given to the patient. Patient received prescription.  D/c education completed with patient including follow up instructions, medication list, d/c activities limitations if indicated, with other d/c instructions as indicated by MD - patient able to verbalize understanding, all questions fully answered.   Patient instructed to return to ED, call 911, or call MD for any changes in condition.   Patient escorted to the main entrance, and D/C home via taxi.  Colton Blair 06/09/2023 5:14 PM

## 2023-06-09 NOTE — Progress Notes (Signed)
   06/09/23 0800  Psych Admission Type (Psych Patients Only)  Admission Status Voluntary  Psychosocial Assessment  Patient Complaints None  Eye Contact Fair  Facial Expression Animated  Affect Appropriate to circumstance  Speech Logical/coherent  Interaction Assertive  Behavior Characteristics Appropriate to situation;Cooperative  Mood Pleasant  Thought Process  Coherency WDL  Content WDL  Delusions None reported or observed  Perception WDL  Hallucination None reported or observed  Judgment Impaired  Confusion None  Danger to Self  Current suicidal ideation? Denies  Agreement Not to Harm Self Yes  Description of Agreement Verbal

## 2023-06-09 NOTE — TOC Transition Note (Signed)
 06/09/2023  Colton Blair DOB: [birth year] MRN: [id number]   RIDER WAIVER AND RELEASE OF LIABILITY  For the purposes of helping with transportation needs, [place] partners with outside transportation providers (taxi companies, [place], Catering manager.) to give [employer] patients or other approved people the choice of on-demand rides [employer]") to our buildings for non-emergency visits.  By using [employer], I, the person signing this document, on behalf of myself and/or any legal minors (in my care using the [employer]), agree:  Science writer given to me are supplied by independent, outside transportation providers who do not work for, or have any affiliation with, [employer]. [place] is not a transportation company. [place] has no control over the quality or safety of the rides I get using [employer]. [place] has no control over whether any outside ride will happen on time or not. [place] gives no guarantee on the reliability, quality, safety, or availability on any rides, or that no mistakes will happen. I know and accept that traveling by vehicle (car, truck, SVU, Zenaida Niece, bus, taxi, etc.) has risks of serious injuries such as disability, being paralyzed, and death. I know and agree the risk of using [employer] is mine alone, and not [employer]. Transport Services are provided "as is" and as are available. The transportation providers are in charge for all inspections and care of the vehicles used to provide these rides. I agree not to take legal action against [place], its agents, employees, officers, directors, representatives, insurers, attorneys, assigns, successors, subsidiaries, and affiliates at any time for any reasons related directly or indirectly to using [employer]. I also agree not to take legal action against [place] or its affiliates for any injury, death, or damage to property caused by or related to using  [employer]. I have read this Waiver and Release of Liability, and I understand the terms used in it and their legal meaning. This Waiver is freely and voluntarily given with the understanding that my right (or any legal minors) to legal action against [place] relating to [employer] is knowingly given up to use these services.   I attest that I read the Ride Waiver and Release of Liability to Colton Blair, gave Mr. Rockers the opportunity to ask questions and answered the questions asked (if any). I affirm that Colton Blair then provided consent for assistance with transportation.    Read Drivers, LCSWA 06/09/2023

## 2023-06-09 NOTE — Plan of Care (Signed)
   Problem: Education: Goal: Knowledge of [facility] Education information/materials will improve Outcome: Completed/Met Goal: Emotional status will improve Outcome: Completed/Met Goal: Mental status will improve Outcome: Completed/Met Goal: Verbalization of understanding the information provided will improve Outcome: Completed/Met   Problem: Activity: Goal: Interest or engagement in activities will improve Outcome: Completed/Met Goal: Sleeping patterns will improve Outcome: Completed/Met   Problem: Coping: Goal: Ability to verbalize frustrations and anger appropriately will improve Outcome: Completed/Met Goal: Ability to demonstrate self-control will improve Outcome: Completed/Met   Problem: Health Behavior/Discharge Planning: Goal: Identification of resources available to assist in meeting health care needs will improve Outcome: Completed/Met Goal: Compliance with treatment plan for underlying cause of condition will improve Outcome: Completed/Met   Problem: Physical Regulation: Goal: Ability to maintain clinical measurements within normal limits will improve Outcome: Completed/Met   Problem: Safety: Goal: Periods of time without injury will increase Outcome: Completed/Met

## 2023-06-09 NOTE — BHH Suicide Risk Assessment (Signed)
 Suicide Risk Assessment  Discharge Assessment    [facility] Discharge Suicide Risk Assessment   Principal Problem: Substance induced mood disorder [facility]) Discharge Diagnoses: Principal Problem:   Substance induced mood disorder (HCC)   During the patient's hospitalization, patient had extensive initial psychiatric evaluation, and follow-up psychiatric evaluations every day.  Psychiatric diagnoses provided upon initial assessment: MDD, PTSD, Substance induced mood disorder  Patient's psychiatric medications were adjusted on admission: Medications were not started on admission due to cardiac concerns.  During the hospitalization, other adjustments were made to the patient's psychiatric medication regimen: Zoloft was started, but then stopped at patients request.  Seroquel was started and titrated.  Gradually, patient started adjusting to milieu.   Patient's care was discussed during the interdisciplinary team meeting every day during the hospitalization.  The patient is not having side effects to prescribed psychiatric medication.  The patient reports their target psychiatric symptoms of depression and SI responded well to the psychiatric medications, and the patient reports overall benefit other psychiatric hospitalization. Supportive psychotherapy was provided to the patient. The patient also participated in regular group therapy while admitted.   Labs were reviewed with the patient, and abnormal results were discussed with the patient.  The patient denied having suicidal thoughts more than 48 hours prior to discharge.  Patient denies having homicidal thoughts.  Patient denies having auditory hallucinations.  Patient denies any visual hallucinations.  Patient denies having paranoid thoughts.  The patient is able to verbalize their individual safety plan to this provider.  It is recommended to the patient to continue psychiatric medications as prescribed, after discharge from the hospital.     It is recommended to the patient to follow up with your outpatient psychiatric provider and PCP.  Discussed with the patient, the impact of alcohol, drugs, tobacco have been there overall psychiatric and medical wellbeing, and total abstinence from substance use was recommended the patient.  Total Time spent with patient: 30 minutes  Musculoskeletal: Strength & Muscle Tone: within normal limits Gait & Station: normal Patient leans: N/A  Psychiatric Specialty Exam  Presentation  General Appearance:  Casual  Eye Contact: Fair  Speech: Clear and Coherent; Normal Rate  Speech Volume: Normal  Handedness: Right   Mood and Affect  Mood: -- ("ok")  Duration of Depression Symptoms: Greater than two weeks  Affect: Appropriate; Congruent   Thought Process  Thought Processes: Coherent; Goal Directed  Descriptions of Associations:Intact  Orientation:Full (Time, Place and Person)  Thought Content:Logical; WDL  History of Schizophrenia/Schizoaffective disorder:No  Duration of Psychotic Symptoms:N/A  Hallucinations:Hallucinations: None  Ideas of Reference:None  Suicidal Thoughts:Suicidal Thoughts: No  Homicidal Thoughts:Homicidal Thoughts: No   Sensorium  Memory: Immediate Good  Judgment: Fair  Insight: Fair   Art therapist  Concentration: Fair  Attention Span: Fair  Recall: Good  Fund of Knowledge: Good  Language: Good   Psychomotor Activity  Psychomotor Activity: Psychomotor Activity: Normal   Assets  Assets: Communication Skills; Resilience   Sleep  Sleep: Sleep: Good Number of Hours of Sleep: 7.75   Physical Exam: Physical Exam Vitals and nursing note reviewed.  Constitutional:      General: He is not in acute distress.    Appearance: Normal appearance. He is normal weight. He is not ill-appearing or toxic-appearing.  HENT:     Head: Normocephalic and atraumatic.  Pulmonary:     Effort: Pulmonary effort  is normal.  Musculoskeletal:        General: Normal range of motion.  Neurological:  General: No focal deficit present.     Mental Status: He is alert.    Review of Systems  Respiratory:  Negative for cough and shortness of breath.   Cardiovascular:  Negative for chest pain.  Gastrointestinal:  Negative for abdominal pain, constipation, diarrhea, nausea and vomiting.  Neurological:  Negative for dizziness, weakness and headaches.  Psychiatric/Behavioral:  Negative for depression, hallucinations and suicidal ideas. The patient is not nervous/anxious.    Blood pressure 103/88, pulse 87, temperature 98.2 F (36.8 C), temperature source Oral, resp. rate 18, height 5\' 10"  (1.778 m), weight 74 kg, SpO2 100%. Body mass index is 23.42 kg/m.  Mental Status Per Nursing Assessment::   On Admission:  NA  Demographic Factors:  Male, Low socioeconomic status, and Unemployed  Loss Factors: Financial problems/change in socioeconomic status  Historical Factors: Prior suicide attempts  Risk Reduction Factors:   NA  Continued Clinical Symptoms:  Alcohol/Substance Abuse/Dependencies Previous Psychiatric Diagnoses and Treatments Medical Diagnoses and Treatments/Surgeries  Cognitive Features That Contribute To Risk:  Loss of executive function    Suicide Risk:  Mild:  No Suicidal ideation.  There are no identifiable plans, no associated intent, mild dysphoria and related symptoms, good self-control (both objective and subjective assessment), few other risk factors, and identifiable protective factors, including available and accessible social support.  However, since he does have a history of a prior attempt he does have some risk present.   Follow-up Information     [facility]. Go on 06/19/2023.   Specialty: Family Medicine Why: You have a hospital follow up appointment for primary care services on  06/19/23 at 10:00 am.  The appointment will be held in  person. Contact information: [address] [place] [address] [phone] Additional information: [address]  [address]  [place], [place] [address]    - Parking lot with [employer], [employer], [employer]; Next to [facility] Urgent Care   - [facility] shopping - Off of [address]          [facility]. Go to.   Specialty: Behavioral Health Why: Please go to this provider for medication management services, Monday through Friday, arrive by 7:00 am. Contact information: [address] [place] [address] [phone]        [place]. Go to.   Specialty: Professional Counselor Why: Please go to this provider for therapy services on Monday through Friday, between 9 am and 1 pm. Contact information: [address] [place] [address] [phone]                 Plan Of Care/Follow-up recommendations:  Activity: as tolerated  Diet: heart healthy  Other: -Follow-up with your outpatient psychiatric provider -instructions on appointment date, time, and address (location) are provided to you in discharge paperwork.  -Take your psychiatric medications as prescribed at discharge - instructions are provided to you in the discharge paperwork  -Follow-up with outpatient primary care doctor and other specialists -for management of chronic medical disease, including: Cardiac issues.  Prolonged Qt.  Recheck Vitamin D.  -Testing: Follow-up with outpatient provider for abnormal lab results: Prolonged Qt. Recheck Vitamin D.  -Recommend abstinence from alcohol, tobacco, and other illicit drug use at discharge.   -If your psychiatric symptoms recur, worsen, or if you have side effects to your psychiatric medications, call your outpatient psychiatric provider, 911, 988 or go to the nearest emergency department.  -  If suicidal thoughts recur, call your  outpatient psychiatric provider, 911, 988 or go to the nearest emergency department.   Lauro Franklin, MD 06/09/2023, 9:27 AM

## 2023-06-09 NOTE — BHH Suicide Risk Assessment (Incomplete)
BHH INPATIENT:  Family/Significant Other Suicide Prevention Education  Suicide Prevention Education:  Education Completed; Dennie Bible                                          ,  (name of family member/significant other) has been identified by the patient as the family member/significant other with whom the patient will be residing, and identified as the person(s) who will aid the patient in the event of a mental health crisis (suicidal ideations/suicide attempt).  With written consent from the patient, the family member/significant other has been provided the following suicide prevention education, prior to the and/or following the discharge of the patient.  The suicide prevention education provided includes the following: Suicide risk factors Suicide prevention and interventions National Suicide Hotline telephone number [facility] assessment telephone number [facility] Emergency Assistance 911 [facility] and/or Residential Mobile Crisis Unit telephone number  Request made of family/significant other to: Remove weapons (e.g., guns, rifles, knives), all items previously/currently identified as safety concern.   Remove drugs/medications (over-the-counter, prescriptions, illicit drugs), all items previously/currently identified as a safety concern.  The family member/significant other verbalizes understanding of the suicide prevention education information provided.  The family member/significant other agrees to remove the items of safety concern listed above.  Aaryn Parrilla O Camdan Burdi 06/09/2023, 9:34 AM

## 2023-06-09 NOTE — BHH Suicide Risk Assessment (Signed)
 BHH INPATIENT:  Family/Significant Other Suicide Prevention Education  Suicide Prevention Education:  Education Completed; Ellin Saba (cousin) [phone],  (name of family member/significant other) has been identified by the patient as the family member/significant other with whom the patient will be residing, and identified as the person(s) who will aid the patient in the event of a mental health crisis (suicidal ideations/suicide attempt).  With written consent from the patient, the family member/significant other has been provided the following suicide prevention education, prior to the and/or following the discharge of the patient.  Cousin said that there are no guns in the home.  He will secure medications and sharp objects (such as knives and scissors).  Cousin doesn't have any safety concerns about patient coming to his home.  Cousin said that patient will stay there until he finds another home.  Cousin works until 4 PM, and will be back home around 4:30 PM.  His address is:  [address] [place], [place].  The suicide prevention education provided includes the following: Suicide risk factors Suicide prevention and interventions National Suicide Hotline telephone number [facility] assessment telephone number [facility] Emergency Assistance 911 [facility] and/or Residential Mobile Crisis Unit telephone number  Request made of family/significant other to: Remove weapons (e.g., guns, rifles, knives), all items previously/currently identified as safety concern.   Remove drugs/medications (over-the-counter, prescriptions, illicit drugs), all items previously/currently identified as a safety concern.  The family member/significant other verbalizes understanding of the suicide prevention education information provided.  The family member/significant other agrees to remove the items of safety concern listed above.  Osten Janek O Karilyn Wind 06/09/2023, 9:38 AM

## 2023-06-09 NOTE — BHH Group Notes (Signed)
 Adult Psychoeducational Group Note  Date:  06/09/2023 Time:  4:33 PM  Group Topic/Focus:  Emotional Education:   The focus of this group is to discuss what feelings/emotions are, and how they are experienced. Goals Group:   The focus of this group is to help patients establish daily goals to achieve during treatment and discuss how the patient can incorporate goal setting into their daily lives to aide in recovery. Orientation:   The focus of this group is to educate the patient on the purpose and policies of crisis stabilization and provide a format to answer questions about their admission.  The group details unit policies and expectations of patients while admitted.  Participation Level:  Did Not Attend   Additional Comments:  Did not attend  Colton Blair 06/09/2023, 4:33 PM
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
## 2023-06-09 NOTE — Group Note (Signed)
 LCSW Group Therapy Note   Group Date: 06/09/2023 Start Time: 1100 End Time: 1200  Participation:  did not attend  Type of Therapy:  Group Therapy  Topic:  Healing From Within: Understanding Our Past, Building Our Future   Objective:  To help participants understand the impact of early experiences on mental and physical health, with a focus on Adverse Childhood Experiences (ACEs), and to explore ways to build resilience and healing.  Goals: Understand ACEs and Their Impact: Learn how childhood experiences shape mental and physical health. Build Resilience: Develop strategies for overcoming challenges and creating positive change. Promote Healing: Recognize the value of support and the possibility of healing through therapy and self-care.  Therapeutic Modalities Used: Psychoeducation: Sharing information about ACEs and their effects. Cognitive Behavioral Therapy (CBT): Helping reframe negative thought patterns. Trauma-Informed Therapy: Creating a safe, supportive space for healing.   Alla Feeling, LCSWA 06/09/2023  1:29 PM

## 2023-06-09 NOTE — Group Note (Signed)
 Recreation Therapy Group Note   Group Topic:Animal Assisted Therapy   Group Date: 06/09/2023 Start Time: 0945 End Time: 1030 Facilitators: Adraine Biffle-McCall, LRT,CTRS Location: [address] Dayroom   Animal-Assisted Activity (AAA) Program Checklist/Progress Notes Patient Eligibility Criteria Checklist & Daily Group note for Rec Tx Intervention  AAA/T Program Assumption of Risk Form signed by Patient/ or Parent Legal Guardian Yes  Patient understands his/her participation is voluntary Yes  Education: Charity fundraiser, Appropriate Animal Interaction   Education Outcome: Acknowledges education.   Clinical Observations/Feedback: Patient attended session and interacted appropriately with therapy dog and peers. Patient asked appropriate questions about therapy dog and his training. Patient shared stories about their pets at home with group.   Colton Blair, LRT/CTRS    Affect/Mood: N/A   Participation Level: Did not attend    Clinical Observations/Individualized Feedback:      Plan: Continue to engage patient in RT group sessions 2-3x/week.   Colton Blair, LRT,CTRS 06/09/2023 1:42 PM

## 2023-06-18 ENCOUNTER — Emergency Department (HOSPITAL_COMMUNITY): Payer: Medicaid - Out of State

## 2023-06-18 ENCOUNTER — Ambulatory Visit (HOSPITAL_COMMUNITY)
Admission: EM | Admit: 2023-06-18 | Discharge: 2023-07-14 | Disposition: E | Payer: Medicaid - Out of State | Attending: Emergency Medicine | Admitting: Emergency Medicine

## 2023-06-18 ENCOUNTER — Emergency Department (HOSPITAL_COMMUNITY): Payer: Medicaid - Out of State | Admitting: Certified Registered Nurse Anesthetist

## 2023-06-18 ENCOUNTER — Encounter (HOSPITAL_COMMUNITY): Admission: EM | Disposition: E | Payer: Self-pay | Source: Home / Self Care | Attending: Emergency Medicine

## 2023-06-18 DIAGNOSIS — T1490XA Injury, unspecified, initial encounter: Secondary | ICD-10-CM

## 2023-06-18 DIAGNOSIS — T148XXA Other injury of unspecified body region, initial encounter: Secondary | ICD-10-CM

## 2023-06-18 DIAGNOSIS — S41101A Unspecified open wound of right upper arm, initial encounter: Secondary | ICD-10-CM | POA: Diagnosis not present

## 2023-06-18 DIAGNOSIS — W260XXA Contact with knife, initial encounter: Secondary | ICD-10-CM | POA: Insufficient documentation

## 2023-06-18 DIAGNOSIS — S41121A Laceration with foreign body of right upper arm, initial encounter: Secondary | ICD-10-CM | POA: Diagnosis not present

## 2023-06-18 DIAGNOSIS — T794XXA Traumatic shock, initial encounter: Secondary | ICD-10-CM | POA: Insufficient documentation

## 2023-06-18 DIAGNOSIS — J90 Pleural effusion, not elsewhere classified: Secondary | ICD-10-CM | POA: Diagnosis not present

## 2023-06-18 DIAGNOSIS — S41111A Laceration without foreign body of right upper arm, initial encounter: Secondary | ICD-10-CM | POA: Diagnosis present

## 2023-06-18 DIAGNOSIS — R578 Other shock: Secondary | ICD-10-CM | POA: Diagnosis not present

## 2023-06-18 HISTORY — PX: ARTERY EXPLORATION: SHX5110

## 2023-06-18 HISTORY — PX: THORACOTOMY: SHX5074

## 2023-06-18 LAB — I-STAT CHEM 8, ED
BUN: 10 mg/dL (ref 8–23)
Calcium, Ion: 0.99 mmol/L — ABNORMAL LOW (ref 1.15–1.40)
Chloride: 106 mmol/L (ref 98–111)
Creatinine, Ser: 1.3 mg/dL — ABNORMAL HIGH (ref 0.61–1.24)
Glucose, Bld: 244 mg/dL — ABNORMAL HIGH (ref 70–99)
HCT: 33 % — ABNORMAL LOW (ref 39.0–52.0)
Hemoglobin: 11.2 g/dL — ABNORMAL LOW (ref 13.0–17.0)
Potassium: 4.4 mmol/L (ref 3.5–5.1)
Sodium: 141 mmol/L (ref 135–145)
TCO2: 16 mmol/L — ABNORMAL LOW (ref 22–32)

## 2023-06-18 LAB — I-STAT CG4 LACTIC ACID, ED: Lactic Acid, Venous: >15 mmol/L (ref 0.5–1.9)

## 2023-06-18 SURGERY — EXPLORATION, ARTERY
Anesthesia: General | Site: Chest | Laterality: Right

## 2023-06-18 MED ORDER — TRANEXAMIC ACID-NACL 1000-0.7 MG/100ML-% IV SOLN
1000.0000 mg | Freq: Once | INTRAVENOUS | Status: AC
Start: 2023-06-18 — End: 2023-06-18
  Administered 2023-06-18: 1000 mg via INTRAVENOUS

## 2023-06-18 MED ORDER — TRANEXAMIC ACID 1000 MG/10ML IV SOLN
1000.0000 mg | Freq: Once | INTRAVENOUS | Status: DC
Start: 2023-06-18 — End: 2023-06-19
  Filled 2023-06-18: qty 10

## 2023-06-18 MED ORDER — VASOPRESSIN 20 UNIT/ML IV SOLN
INTRAVENOUS | Status: AC
Start: 2023-06-18 — End: ?
  Filled 2023-06-18: qty 1

## 2023-06-18 MED ORDER — CEFAZOLIN SODIUM-DEXTROSE 2-4 GM/100ML-% IV SOLN: 2.0000 g | Freq: Once | INTRAVENOUS | Status: DC

## 2023-06-18 MED ORDER — EPINEPHRINE 1 MG/10ML IJ SOSY
PREFILLED_SYRINGE | INTRAMUSCULAR | Status: DC | PRN
  Administered 2023-06-18 (×6): 1 mg via INTRAVENOUS

## 2023-06-18 MED ORDER — NOREPINEPHRINE 4 MG/250ML-% IV SOLN
INTRAVENOUS | Status: DC | PRN
  Administered 2023-06-18: 10 ug/min via INTRAVENOUS
  Administered 2023-06-18: 20 ug/min via INTRAVENOUS

## 2023-06-18 MED ORDER — FENTANYL CITRATE (PF) 250 MCG/5ML IJ SOLN
INTRAMUSCULAR | Status: AC
  Filled 2023-06-18: qty 5

## 2023-06-18 MED ORDER — FENTANYL CITRATE PF 50 MCG/ML IJ SOSY
PREFILLED_SYRINGE | INTRAMUSCULAR | Status: AC
  Filled 2023-06-18: qty 1

## 2023-06-18 MED ORDER — TETANUS-DIPHTH-ACELL PERTUSSIS 5-2.5-18.5 LF-MCG/0.5 IM SUSY: 0.5000 mL | PREFILLED_SYRINGE | Freq: Once | INTRAMUSCULAR | Status: DC

## 2023-06-18 MED ORDER — FENTANYL CITRATE PF 50 MCG/ML IJ SOSY: 50.0000 ug | PREFILLED_SYRINGE | Freq: Once | INTRAMUSCULAR | Status: DC

## 2023-06-18 MED ORDER — CALCIUM CHLORIDE 10 % IV SOLN
INTRAVENOUS | Status: DC | PRN
  Administered 2023-06-18 (×3): 1 g via INTRAVENOUS

## 2023-06-18 MED ORDER — VASOPRESSIN 20 UNIT/ML IV SOLN
INTRAVENOUS | Status: DC | PRN
  Administered 2023-06-18: 2 [IU] via INTRAVENOUS
  Administered 2023-06-18: 5 [IU] via INTRAVENOUS
  Administered 2023-06-18: 10 [IU] via INTRAVENOUS
  Administered 2023-06-18: 20 [IU] via INTRAVENOUS
  Administered 2023-06-18 (×2): 10 [IU] via INTRAVENOUS
  Administered 2023-06-18: 20 [IU] via INTRAVENOUS
  Administered 2023-06-18: 3 [IU] via INTRAVENOUS

## 2023-06-18 MED ORDER — SODIUM BICARBONATE 8.4 % IV SOLN
INTRAVENOUS | Status: DC | PRN
  Administered 2023-06-18 (×2): 50 meq via INTRAVENOUS

## 2023-06-18 MED ORDER — SODIUM CHLORIDE 0.9% IV SOLUTION: Freq: Once | INTRAVENOUS | Status: DC

## 2023-06-18 MED ORDER — EPINEPHRINE HCL 5 MG/250ML IV SOLN IN NS
0.5000 ug/min | INTRAVENOUS | Status: DC
  Administered 2023-06-18: 20 ug/min via INTRAVENOUS

## 2023-06-18 MED ORDER — MIDAZOLAM HCL 2 MG/2ML IJ SOLN
INTRAMUSCULAR | Status: AC
  Filled 2023-06-18: qty 2

## 2023-06-18 MED ORDER — FENTANYL CITRATE PF 50 MCG/ML IJ SOSY
50.0000 ug | PREFILLED_SYRINGE | Freq: Once | INTRAMUSCULAR | Status: AC
  Administered 2023-06-18: 50 ug via INTRAVENOUS

## 2023-06-18 MED ORDER — IOHEXOL 350 MG/ML SOLN
75.0000 mL | Freq: Once | INTRAVENOUS | Status: AC | PRN
  Administered 2023-06-18: 75 mL via INTRAVENOUS

## 2023-06-18 SURGICAL SUPPLY — 14 items
BLADE STERNUM SYSTEM 6 (BLADE) IMPLANT
BNDG GAUZE DERMACEA FLUFF 4 (GAUZE/BANDAGES/DRESSINGS) IMPLANT
GLOVE BIO SURGEON STRL SZ7 (GLOVE) IMPLANT
GLOVE BIOGEL PI IND STRL 6.5 (GLOVE) IMPLANT
GLOVE BIOGEL PI IND STRL 8 (GLOVE) IMPLANT
GLOVE INDICATOR 7.0 STRL GRN (GLOVE) IMPLANT
GLOVE SURG SS PI 6.5 STRL IVOR (GLOVE) IMPLANT
GLOVE SURG SS PI 8.0 STRL IVOR (GLOVE) IMPLANT
KIT BASIN OR (CUSTOM PROCEDURE TRAY) IMPLANT
PACK PERIPHERAL VASCULAR (CUSTOM PROCEDURE TRAY) IMPLANT
PAD ONESTEP ZOLL R SERIES ADT (MISCELLANEOUS) IMPLANT
SPONGE T-LAP 18X18 ~~LOC~~+RFID (SPONGE) IMPLANT
SUT ETHILON 2 0 PSLX (SUTURE) IMPLANT
TRAY FOLEY MTR SLVR 16FR STAT (SET/KITS/TRAYS/PACK) IMPLANT
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
## 2023-06-19 ENCOUNTER — Encounter (HOSPITAL_COMMUNITY): Payer: Self-pay | Admitting: Vascular Surgery

## 2023-06-19 ENCOUNTER — Other Ambulatory Visit: Payer: Self-pay

## 2023-06-19 ENCOUNTER — Inpatient Hospital Stay: Payer: Medicaid - Out of State | Admitting: Family Medicine

## 2023-06-19 ENCOUNTER — Emergency Department (HOSPITAL_COMMUNITY): Payer: Medicaid - Out of State

## 2023-06-19 DIAGNOSIS — S45091A Other specified injury of axillary artery, right side, initial encounter: Secondary | ICD-10-CM

## 2023-06-19 LAB — PREPARE PLATELET PHERESIS
Unit division: 0
Unit division: 0

## 2023-06-19 LAB — PREPARE CRYOPRECIPITATE: Unit division: 0

## 2023-06-19 LAB — POCT I-STAT, CHEM 8
BUN: 10 mg/dL (ref 6–20)
Calcium, Ion: 0.99 mmol/L — ABNORMAL LOW (ref 1.15–1.40)
Chloride: 106 mmol/L (ref 98–111)
Creatinine, Ser: 1.3 mg/dL — ABNORMAL HIGH (ref 0.61–1.24)
Glucose, Bld: 244 mg/dL — ABNORMAL HIGH (ref 70–99)
HCT: 33 % — ABNORMAL LOW (ref 39.0–52.0)
Hemoglobin: 11.2 g/dL — ABNORMAL LOW (ref 13.0–17.0)
Potassium: 4.4 mmol/L (ref 3.5–5.1)
Sodium: 141 mmol/L (ref 135–145)
TCO2: 16 mmol/L — ABNORMAL LOW (ref 22–32)

## 2023-06-19 LAB — BPAM PLATELET PHERESIS
Blood Product Expiration Date: 202503092359
Blood Product Expiration Date: 202503092359
ISSUE DATE / TIME: 202503062320
ISSUE DATE / TIME: 202503062346
Unit Type and Rh: 7300
Unit Type and Rh: 7300

## 2023-06-19 LAB — PREPARE RBC (CROSSMATCH)

## 2023-06-19 LAB — BPAM CRYOPRECIPITATE
Blood Product Expiration Date: 202503092359
ISSUE DATE / TIME: 202503062323
Unit Type and Rh: 6200

## 2023-06-19 LAB — ABO/RH: ABO/RH(D): O POS

## 2023-06-19 MED ORDER — LACTATED RINGERS IV SOLN: INTRAVENOUS | Status: DC | PRN

## 2023-06-19 MED ORDER — PHENYLEPHRINE HCL-NACL 20-0.9 MG/250ML-% IV SOLN
INTRAVENOUS | Status: DC | PRN
  Administered 2023-06-18: 60 ug/min via INTRAVENOUS

## 2023-06-19 MED ORDER — CEFAZOLIN SODIUM-DEXTROSE 2-3 GM-%(50ML) IV SOLR
INTRAVENOUS | Status: DC | PRN
  Administered 2023-06-18: 2 g via INTRAVENOUS

## 2023-06-19 MED ORDER — 0.9 % SODIUM CHLORIDE (POUR BTL) OPTIME
TOPICAL | Status: DC | PRN
  Administered 2023-06-19: 1000 mL

## 2023-06-19 MED ORDER — SUCCINYLCHOLINE CHLORIDE 200 MG/10ML IV SOSY
PREFILLED_SYRINGE | INTRAVENOUS | Status: DC | PRN
  Administered 2023-06-18: 120 mg via INTRAVENOUS

## 2023-06-19 MED ORDER — LIDOCAINE HCL (PF) 2 % IJ SOLN
INTRAMUSCULAR | Status: DC | PRN
  Administered 2023-06-18: 100 mg via INTRADERMAL

## 2023-06-19 MED ORDER — SODIUM CHLORIDE 0.9 % IV SOLN: INTRAVENOUS | Status: DC | PRN

## 2023-06-19 MED ORDER — PHENYLEPHRINE HCL (PRESSORS) 10 MG/ML IV SOLN
INTRAVENOUS | Status: DC | PRN
  Administered 2023-06-18: 360 ug via INTRAVENOUS
  Administered 2023-06-18 (×2): 240 ug via INTRAVENOUS
  Administered 2023-06-18: 800 ug via INTRAVENOUS

## 2023-06-19 MED ORDER — MIDAZOLAM HCL 2 MG/2ML IJ SOLN
INTRAMUSCULAR | Status: DC | PRN
  Administered 2023-06-18: 2 mg via INTRAVENOUS

## 2023-06-19 MED ORDER — PROPOFOL 10 MG/ML IV BOLUS
INTRAVENOUS | Status: DC | PRN
  Administered 2023-06-18: 50 mg via INTRAVENOUS
  Administered 2023-06-18: 150 mg via INTRAVENOUS
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
## 2023-06-19 NOTE — Anesthesia Procedure Notes (Addendum)
 Central Venous Catheter Insertion Performed by: Lewie Loron, MD, anesthesiologist Start/End3/09/2023 11:44 PM, 06/19/2023 11:52 PM Patient location: Pre-op. Preanesthetic checklist: patient identified, IV checked, site marked, risks and benefits discussed, surgical consent, monitors and equipment checked, pre-op evaluation, timeout performed and anesthesia consent Position: supine Lidocaine 1% used for infiltration and patient sedated Hand hygiene performed , maximum sterile barriers used  and Seldinger technique used Catheter size: 8.5 Fr Central line was placed.Sheath introducer Swan type:thermodilution Procedure performed using ultrasound guided technique. Ultrasound Notes:anatomy identified, needle tip was noted to be adjacent to the nerve/plexus identified, no ultrasound evidence of intravascular and/or intraneural injection and image(s) printed for medical record Attempts: 2 Following insertion, line sutured, dressing applied and Biopatch. Post procedure assessment: blood return through all ports, free fluid flow and no air  Patient tolerated the procedure well with no immediate complications.

## 2023-06-19 NOTE — Anesthesia Preprocedure Evaluation (Signed)
 Anesthesia Evaluation  Patient identified by MRN, date of birth, ID band Patient awake    Reviewed: Allergy & Precautions, Patient's Chart, lab work & pertinent test resultsPreop documentation limited or incomplete due to emergent nature of procedure.  Airway Mallampati: II  TM Distance: >3 FB Neck ROM: Full    Dental  (+) Dental Advisory Given   Pulmonary neg pulmonary ROS   Pulmonary exam normal breath sounds clear to auscultation       Cardiovascular negative cardio ROS  Rhythm:Regular Rate:Tachycardia     Neuro/Psych negative neurological ROS     GI/Hepatic negative GI ROS, Neg liver ROS,,,  Endo/Other  negative endocrine ROS    Renal/GU negative Renal ROS     Musculoskeletal negative musculoskeletal ROS (+)    Abdominal   Peds  Hematology negative hematology ROS (+)   Anesthesia Other Findings   Reproductive/Obstetrics                             Anesthesia Physical Anesthesia Plan  ASA: 5 and emergent  Anesthesia Plan: General   Post-op Pain Management:    Induction: Intravenous, Rapid sequence and Cricoid pressure planned  PONV Risk Score and Plan: Midazolam  Airway Management Planned: Oral ETT  Additional Equipment: Arterial line, CVP and Ultrasound Guidance Line Placement  Intra-op Plan:   Post-operative Plan: Post-operative intubation/ventilation  Informed Consent: I have reviewed the patients History and Physical, chart, labs and discussed the procedure including the risks, benefits and alternatives for the proposed anesthesia with the patient or authorized representative who has indicated his/her understanding and acceptance.     Dental advisory given  Plan Discussed with: CRNA  Anesthesia Plan Comments:        Anesthesia Quick Evaluation
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
## 2023-06-19 NOTE — Transfer of Care (Signed)
 Immediate Anesthesia Transfer of Care Note  Patient: Colton Blair  Procedure(s) Performed: EXPLORATION, ARTERY (Right)  Time of Death called by Dr. Andrey Campanile at 23:55 in OR.

## 2023-06-19 NOTE — Anesthesia Postprocedure Evaluation (Signed)
 Anesthesia Post Note  Patient: Colton Blair  Procedure(s) Performed: EXPLORATION, ARTERY (Right)     Patient location during evaluation: Other Anesthesia Type: General Anesthetic complications: no Comments: Pt expired in the OR   No notable events documented.  Last Vitals:  Vitals:   07/04/2023 2302 07/08/2023 2305  BP: (!) 73/44   Pulse:  (!) 40  Resp: (!) 34 (!) 26  SpO2:  (!) 87%    Last Pain:  Vitals:   07/11/2023 2245  PainSc: 10-Worst pain ever                 Lewie Loron

## 2023-06-19 NOTE — Anesthesia Procedure Notes (Addendum)
 Arterial Line Insertion Start/End03/01/2024 11:30 PM,  11:35 PM Performed by: Lewie Loron, MD, anesthesiologist  Patient location: Pre-op. Preanesthetic checklist: patient identified, IV checked, site marked, risks and benefits discussed, surgical consent, monitors and equipment checked, pre-op evaluation, timeout performed and anesthesia consent Lidocaine 1% used for infiltration Left, radial was placed Catheter size: 20 G Hand hygiene performed , maximum sterile barriers used  and Seldinger technique used  Attempts: 1 Procedure performed using ultrasound guided technique. Following insertion, dressing applied and Biopatch. Post procedure assessment: normal and unchanged  Patient tolerated the procedure well with no immediate complications.

## 2023-06-19 NOTE — Anesthesia Procedure Notes (Addendum)
 Procedure Name: Intubation Date/Time: 07/12/2023 11:29 PM  Performed by: Tressia Miners, CRNAPre-anesthesia Checklist: Patient identified, Emergency Drugs available, Suction available, Patient being monitored and Timeout performed Patient Re-evaluated:Patient Re-evaluated prior to induction Oxygen Delivery Method: Circle system utilized Preoxygenation: Pre-oxygenation with 100% oxygen Induction Type: IV induction, Rapid sequence and Cricoid Pressure applied Laryngoscope Size: Mac and 4 Grade View: Grade I Tube type: Oral Tube size: 7.5 mm Number of attempts: 1 Airway Equipment and Method: Stylet Placement Confirmation: ETT inserted through vocal cords under direct vision, positive ETCO2 and breath sounds checked- equal and bilateral Secured at: 23 cm Tube secured with: Tape Dental Injury: Teeth and Oropharynx as per pre-operative assessment  Comments: Emergency OR case. Eyes taped. RSI Performed. DL x 1 with grade 1 view.

## 2023-06-20 LAB — BPAM RBC
Blood Product Expiration Date: 202503132359
Blood Product Expiration Date: 202503192359
Blood Product Expiration Date: 202503192359
Blood Product Expiration Date: 202503192359
Blood Product Expiration Date: 202503192359
Blood Product Expiration Date: 202503192359
Blood Product Expiration Date: 202503282359
Blood Product Expiration Date: 202503282359
Blood Product Expiration Date: 202503282359
Blood Product Expiration Date: 202503282359
Blood Product Expiration Date: 202503282359
Blood Product Expiration Date: 202503282359
Blood Product Expiration Date: 202503282359
Blood Product Expiration Date: 202503282359
Blood Product Expiration Date: 202503282359
Blood Product Expiration Date: 202503282359
Blood Product Expiration Date: 202503282359
Blood Product Expiration Date: 202503282359
Blood Product Expiration Date: 202503282359
Blood Product Expiration Date: 202504072359
Blood Product Expiration Date: 202504072359
Blood Product Expiration Date: 202504092359
Blood Product Expiration Date: 202504122359
Blood Product Expiration Date: 202504122359
Blood Product Expiration Date: 202504122359
Blood Product Expiration Date: 202504122359
Blood Product Expiration Date: 202504122359
Blood Product Expiration Date: 202504122359
ISSUE DATE / TIME: 202503062247
ISSUE DATE / TIME: 202503062247
ISSUE DATE / TIME: 202503062259
ISSUE DATE / TIME: 202503062259
ISSUE DATE / TIME: 202503062303
ISSUE DATE / TIME: 202503062303
ISSUE DATE / TIME: 202503062312
ISSUE DATE / TIME: 202503062312
ISSUE DATE / TIME: 202503062312
ISSUE DATE / TIME: 202503062312
ISSUE DATE / TIME: 202503062312
ISSUE DATE / TIME: 202503062312
ISSUE DATE / TIME: 202503062312
ISSUE DATE / TIME: 202503062312
ISSUE DATE / TIME: 202503062319
ISSUE DATE / TIME: 202503062319
ISSUE DATE / TIME: 202503062336
ISSUE DATE / TIME: 202503062336
ISSUE DATE / TIME: 202503062336
ISSUE DATE / TIME: 202503062336
ISSUE DATE / TIME: 202503062351
ISSUE DATE / TIME: 202503062351
ISSUE DATE / TIME: 202503070025
ISSUE DATE / TIME: 202503070505
ISSUE DATE / TIME: 202503070754
ISSUE DATE / TIME: 202503070754
ISSUE DATE / TIME: 202503070759
ISSUE DATE / TIME: 202503070759
Unit Type and Rh: 5100
Unit Type and Rh: 5100
Unit Type and Rh: 5100
Unit Type and Rh: 5100
Unit Type and Rh: 5100
Unit Type and Rh: 5100
Unit Type and Rh: 5100
Unit Type and Rh: 5100
Unit Type and Rh: 5100
Unit Type and Rh: 5100
Unit Type and Rh: 5100
Unit Type and Rh: 5100
Unit Type and Rh: 5100
Unit Type and Rh: 5100
Unit Type and Rh: 5100
Unit Type and Rh: 5100
Unit Type and Rh: 5100
Unit Type and Rh: 5100
Unit Type and Rh: 5100
Unit Type and Rh: 5100
Unit Type and Rh: 5100
Unit Type and Rh: 5100
Unit Type and Rh: 5100
Unit Type and Rh: 5100
Unit Type and Rh: 5100
Unit Type and Rh: 5100
Unit Type and Rh: 5100
Unit Type and Rh: 5100

## 2023-06-20 LAB — BPAM FFP
Blood Product Expiration Date: 202503102359
Blood Product Expiration Date: 202503102359
Blood Product Expiration Date: 202503112359
Blood Product Expiration Date: 202503112359
Blood Product Expiration Date: 202503112359
Blood Product Expiration Date: 202503112359
Blood Product Expiration Date: 202503112359
Blood Product Expiration Date: 202503112359
Blood Product Expiration Date: 202503152359
Blood Product Expiration Date: 202503162359
Blood Product Expiration Date: 202503162359
Blood Product Expiration Date: 202503162359
Blood Product Expiration Date: 202503162359
Blood Product Expiration Date: 202503192359
ISSUE DATE / TIME: 202503062303
ISSUE DATE / TIME: 202503062303
ISSUE DATE / TIME: 202503062312
ISSUE DATE / TIME: 202503062312
ISSUE DATE / TIME: 202503062312
ISSUE DATE / TIME: 202503062312
ISSUE DATE / TIME: 202503062312
ISSUE DATE / TIME: 202503062312
ISSUE DATE / TIME: 202503062312
ISSUE DATE / TIME: 202503062312
ISSUE DATE / TIME: 202503071328
ISSUE DATE / TIME: 202503071328
ISSUE DATE / TIME: 202503071331
ISSUE DATE / TIME: 202503071331
Unit Type and Rh: 6200
Unit Type and Rh: 6200
Unit Type and Rh: 6200
Unit Type and Rh: 6200
Unit Type and Rh: 6200
Unit Type and Rh: 6200
Unit Type and Rh: 6200
Unit Type and Rh: 6200
Unit Type and Rh: 6200
Unit Type and Rh: 6200
Unit Type and Rh: 6200
Unit Type and Rh: 6200
Unit Type and Rh: 6200
Unit Type and Rh: 6200

## 2023-06-20 LAB — PREPARE FRESH FROZEN PLASMA
Unit division: 0
Unit division: 0
Unit division: 0
Unit division: 0
Unit division: 0
Unit division: 0
Unit division: 0
Unit division: 0
Unit division: 0

## 2023-06-20 LAB — TYPE AND SCREEN
ABO/RH(D): O POS
Antibody Screen: NEGATIVE
Unit division: 0
Unit division: 0
Unit division: 0
Unit division: 0
Unit division: 0
Unit division: 0
Unit division: 0
Unit division: 0
Unit division: 0
Unit division: 0
Unit division: 0
Unit division: 0
Unit division: 0
Unit division: 0
Unit division: 0
Unit division: 0
Unit division: 0
Unit division: 0
Unit division: 0
Unit division: 0
Unit division: 0
Unit division: 0
Unit division: 0
Unit division: 0
Unit division: 0
Unit division: 0
Unit division: 0
Unit division: 0

## 2023-06-22 ENCOUNTER — Encounter (HOSPITAL_COMMUNITY): Payer: Self-pay | Admitting: Psychiatry
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
## 2023-07-14 NOTE — ED Notes (Signed)
Unable to obtain manual BP at this time 

## 2023-07-14 NOTE — Progress Notes (Signed)
 Orthopedic Tech Progress Note Patient Details:  Colton Blair 04/14/1876 [id number]  Patient ID: Colton Blair, male   DOB: 04/14/1876, 28 y.o.   MRN: [id number] Level I; not currently needed. Darleen Crocker 06/26/23, 11:06 PM

## 2023-07-14 DEATH — deceased
